# Patient Record
Sex: Male | Born: 1975 | Race: White | Hispanic: No | Marital: Married | Smoking: Former smoker
Health system: Southern US, Community
[De-identification: ages and names within clinical notes are randomized; demographics above are authoritative.]

## PROBLEM LIST (undated history)

## (undated) DIAGNOSIS — G459 Transient cerebral ischemic attack, unspecified: Secondary | ICD-10-CM

## (undated) DIAGNOSIS — F32A Depression, unspecified: Secondary | ICD-10-CM

## (undated) DIAGNOSIS — E7800 Pure hypercholesterolemia, unspecified: Secondary | ICD-10-CM

## (undated) DIAGNOSIS — I10 Essential (primary) hypertension: Secondary | ICD-10-CM

## (undated) DIAGNOSIS — F329 Major depressive disorder, single episode, unspecified: Secondary | ICD-10-CM

## (undated) HISTORY — DX: Transient cerebral ischemic attack, unspecified: G45.9

## (undated) HISTORY — PX: CHOLECYSTECTOMY: SHX55

## (undated) HISTORY — DX: Pure hypercholesterolemia, unspecified: E78.00

## (undated) HISTORY — PX: ELBOW SURGERY: SHX618

## (undated) HISTORY — DX: Essential (primary) hypertension: I10

---

## 2001-03-17 ENCOUNTER — Encounter (INDEPENDENT_AMBULATORY_CARE_PROVIDER_SITE_OTHER): Payer: Self-pay | Admitting: *Deleted

## 2001-03-17 ENCOUNTER — Ambulatory Visit (HOSPITAL_COMMUNITY): Admission: RE | Admit: 2001-03-17 | Discharge: 2001-03-18 | Payer: Self-pay | Admitting: Surgery

## 2001-03-17 ENCOUNTER — Encounter: Payer: Self-pay | Admitting: Surgery

## 2004-09-04 ENCOUNTER — Emergency Department (HOSPITAL_COMMUNITY): Admission: EM | Admit: 2004-09-04 | Discharge: 2004-09-04 | Payer: Self-pay | Admitting: Emergency Medicine

## 2008-02-10 ENCOUNTER — Ambulatory Visit (HOSPITAL_COMMUNITY): Admission: RE | Admit: 2008-02-10 | Discharge: 2008-02-10 | Payer: Self-pay | Admitting: Family Medicine

## 2008-03-06 ENCOUNTER — Encounter
Admission: RE | Admit: 2008-03-06 | Discharge: 2008-04-11 | Payer: Self-pay | Admitting: Physical Medicine and Rehabilitation

## 2010-12-13 NOTE — Op Note (Signed)
[place]  Patient:    Eugene Mitchell, Eugene Mitchell Visit Number: [id number] MRN: [id number]          Service Type: DSU Location: [id number] Attending Physician:  Andre Lefort Proc. Date: 03/17/01 Adm. Date:  03/17/2001   CC:         Dr. Charlesetta Shanks, [facility]; [address] [place], [place]             [id number]   Operative Report  PREOPERATIVE DIAGNOSIS:  Symptomatic cholelithiasis.  POSTOPERATIVE DIAGNOSIS:  Chronic cholecystitis with cholelithiasis.  OPERATION:  Laparoscopic cholecystectomy with intraoperative cholangiogram.  SURGEON:  Sandria Bales. Ezzard Standing, M.D.  ASSISTANT:  Gita Kudo, M.D.  ANESTHESIA:  General endotracheal.  ESTIMATED BLOOD LOSS:  Minimal.  INDICATIONS:  Eugene Mitchell is a 35 year old white male who comes with symptomatic cholelithiasis and now comes for attempt at laparoscopic cholecystectomy.  The indications and potential complications of the procedure were explained to the patient.  DESCRIPTION OF PROCEDURE:  The patient was placed in the operating room and given a general endotracheal anesthetic as supervised by Dr. Judie Petit. He had 1 g of Ancef at the initiation of the procedure.  He had PAS stockings in place and oral gastric tube placed.  His abdomen was prepped with Betadine solution and sterilely draped.  An infraumbilical incision was made with sharp dissection carried down into the abdominal cavity.  A 0 degree 10 mm laparoscope was inserted through a 12 mm Hasson trocar and the Hasson trocar was secured with a 0 Vicryl suture. The abdomen was then explored.  The right and left lobes of the liver were unremarkable.  The stomach was mildly dilated which was aspirated out with the NG tube.  The remainder of the abdomen was covered pretty much with omentum.  Three additional trocars were then placed.  A 10 mm Ethicon trocar in the subxiphoid location, a 5 mm  Ethicon trocar in the right mid subcostal location and a 5 mm Apple trocar in the right lateral subcostal location.  The gallbladder was grasped, rotated cephalad.  The gallbladder was noted to be invested with fat almost the entire length of the gallbladder and I spent probably a good 10-15 minutes dissecting this invested fat off the gallbladder.  I got down to the cystic duct/gallbladder junction.  I then clipped the cystic artery to the anterior branch, placed a gallbladder clip on the cystic duct.   I shot an intraoperative cholangiogram.  An intraoperative cholangiogram was shot using cut off taut catheter inserted through a 16 gauge Gelco, and this was then inserted through the side of the cystic duct.  I use half strength Hypaque solution about 8 cc and at fluoroscopy showing free flow of contrast down the cystic duct down into the common bile duct into the duodenum.  There was free reflux back into both hepatic radicles.  There was no filling defect and no mass within the common bile duct, and this was felt to be a normal intraoperative cholangiogram.  The taut catheter was then removed.  The cystic duct was triply endoclipped and divided.  The gallbladder was then sharply and blunted dissected from the gallbladder bed using primarily hook Bovie coagulation.  There was a posterior branch identified.  It was doubly endoclipped, and the gallbladder was then sharply taken out with the gallbladder bed.  Prior to complete division of the gallbladder from the gallbladder bed, the triangle of Calot was visualized.  The gallbladder and the triangle of Calot was irrigated with saline and the gallbladder was divided and delivered through the umbilicus and sent to pathology.  The abdomen was then irrigated and each trocar was then removed under direct visualization.  The umbilical trocar site was closed with a 0 Vicryl suture. The skin incision at each site was closed with a 5-0 Vicryl  suture, painted with tincture of Benzoin, steri-stripped and sterilely dressed.  The patient tolerated the procedure well and was transported to the recovery room in good condition.  Sponge and needle count were correct at the end of the case. Attending Physician:  Andre Lefort DD:  03/17/01 TD:  03/17/01 Job: [id number]

## 2012-12-30 ENCOUNTER — Ambulatory Visit (INDEPENDENT_AMBULATORY_CARE_PROVIDER_SITE_OTHER): Payer: BC Managed Care – PPO | Admitting: Nurse Practitioner

## 2012-12-30 ENCOUNTER — Ambulatory Visit (INDEPENDENT_AMBULATORY_CARE_PROVIDER_SITE_OTHER): Payer: BC Managed Care – PPO

## 2012-12-30 VITALS — BP 123/74 | HR 91 | Temp 98.7°F | Ht 68.0 in | Wt 230.0 lb

## 2012-12-30 DIAGNOSIS — I10 Essential (primary) hypertension: Secondary | ICD-10-CM

## 2012-12-30 DIAGNOSIS — T1490XA Injury, unspecified, initial encounter: Secondary | ICD-10-CM

## 2012-12-30 DIAGNOSIS — S60221A Contusion of right hand, initial encounter: Secondary | ICD-10-CM

## 2012-12-30 DIAGNOSIS — S60229A Contusion of unspecified hand, initial encounter: Secondary | ICD-10-CM

## 2012-12-30 NOTE — Patient Instructions (Signed)
Contusion A contusion is a deep bruise. Contusions are the result of an injury that caused bleeding under the skin. The contusion may turn blue, purple, or yellow. Minor injuries will give you a painless contusion, but more severe contusions may stay painful and swollen for a few weeks.  CAUSES  A contusion is usually caused by a blow, trauma, or direct force to an area of the body. SYMPTOMS   Swelling and redness of the injured area.  Bruising of the injured area.  Tenderness and soreness of the injured area.  Pain. DIAGNOSIS  The diagnosis can be made by taking a history and physical exam. An X-ray, CT scan, or MRI may be needed to determine if there were any associated injuries, such as fractures. TREATMENT  Specific treatment will depend on what area of the body was injured. In general, the best treatment for a contusion is resting, icing, elevating, and applying cold compresses to the injured area. Over-the-counter medicines may also be recommended for pain control. Ask your caregiver what the best treatment is for your contusion. HOME CARE INSTRUCTIONS   Put ice on the injured area.  Put ice in a plastic bag.  Place a towel between your skin and the bag.  Leave the ice on for 15-20 minutes, 3-4 times a day.  Only take over-the-counter or prescription medicines for pain, discomfort, or fever as directed by your caregiver. Your caregiver may recommend avoiding anti-inflammatory medicines (aspirin, ibuprofen, and naproxen) for 48 hours because these medicines may increase bruising.  Rest the injured area.  If possible, elevate the injured area to reduce swelling. SEEK IMMEDIATE MEDICAL CARE IF:   You have increased bruising or swelling.  You have pain that is getting worse.  Your swelling or pain is not relieved with medicines. MAKE SURE YOU:   Understand these instructions.  Will watch your condition.  Will get help right away if you are not doing well or get  worse. Document Released: 04/23/2005 Document Revised: 10/06/2011 Document Reviewed: 05/19/2011 Exit[employer] Patient Information 2014 [employer].  

## 2012-12-30 NOTE — Progress Notes (Signed)
  Subjective:    Patient ID: Eugene Mitchell, male    DOB: 30-Apr-1976, 37 y.o.   MRN: [id number]  Hand Pain  The incident occurred 6 to 12 hours ago. The incident occurred at home. The injury mechanism was a direct blow. The pain is present in the right hand. The quality of the pain is described as shooting and cramping. The pain radiates to the right arm (finger tips). The pain is at a severity of 5/10. The pain is mild. The pain has been fluctuating since the incident. Pertinent negatives include no numbness or tingling. He has tried nothing for the symptoms.   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    Review of Systems  Musculoskeletal: Positive for joint swelling.       Right hand pain  Neurological: Negative for tingling and numbness.  All other systems reviewed and are negative.       Objective:   Physical Exam  Constitutional: He appears well-developed and well-nourished.  Cardiovascular: Normal rate, regular rhythm and normal heart sounds.   Pulmonary/Chest: Effort normal and breath sounds normal.  Musculoskeletal: He exhibits tenderness.  Right hand pain with movement, Trace amount of swelling at 4th proximal metacarpal head  Skin: Skin is warm and dry.  Psychiatric: He has a normal mood and affect. His behavior is normal. Judgment and thought content normal.     BP 123/74  Pulse 91  Temp(Src) 98.7 F (37.1 C) (Oral)  Ht 5\' 8"  (1.727 m)  Wt 230 lb (104.327 kg)  BMI 34.98 kg/m2  Right ane xray- No acute findings- no fracture-Preliminary reading by Paulene Floor, FNP  [facility]     Assessment & Plan:   1. Hand contusion, right, initial encounter   2. Injury   3. Hypertension    Orders Placed This Encounter  Procedures  . DG Hand Complete Right    Standing Status: Future     Number of Occurrences: 1     Standing Expiration Date: 03/01/2014    Order Specific Question:  Reason for Exam (SYMPTOM  OR DIAGNOSIS REQUIRED)    Answer:  injury     Order Specific Question:  Preferred imaging location?    Answer:  Internal   Ice if helps Epsom salt soaks bid RTO PRN  Mary-Margaret Daphine Deutscher, FNP

## 2014-06-19 ENCOUNTER — Ambulatory Visit: Payer: BC Managed Care – PPO | Attending: Sports Medicine | Admitting: Physical Therapy

## 2014-06-19 DIAGNOSIS — Z5189 Encounter for other specified aftercare: Secondary | ICD-10-CM | POA: Diagnosis not present

## 2014-06-19 DIAGNOSIS — M25522 Pain in left elbow: Secondary | ICD-10-CM | POA: Diagnosis not present

## 2014-06-19 DIAGNOSIS — M7712 Lateral epicondylitis, left elbow: Secondary | ICD-10-CM | POA: Insufficient documentation

## 2014-06-19 DIAGNOSIS — I10 Essential (primary) hypertension: Secondary | ICD-10-CM | POA: Diagnosis not present

## 2014-06-28 ENCOUNTER — Ambulatory Visit: Payer: BC Managed Care – PPO | Attending: Sports Medicine | Admitting: Physical Therapy

## 2014-06-28 DIAGNOSIS — M7712 Lateral epicondylitis, left elbow: Secondary | ICD-10-CM | POA: Insufficient documentation

## 2014-06-28 DIAGNOSIS — M25522 Pain in left elbow: Secondary | ICD-10-CM | POA: Diagnosis not present

## 2014-06-28 DIAGNOSIS — I10 Essential (primary) hypertension: Secondary | ICD-10-CM | POA: Insufficient documentation

## 2014-06-28 DIAGNOSIS — Z5189 Encounter for other specified aftercare: Secondary | ICD-10-CM | POA: Insufficient documentation

## 2014-06-29 ENCOUNTER — Ambulatory Visit: Payer: BC Managed Care – PPO | Admitting: Physical Therapy

## 2014-06-29 DIAGNOSIS — Z5189 Encounter for other specified aftercare: Secondary | ICD-10-CM | POA: Diagnosis not present

## 2014-07-03 ENCOUNTER — Ambulatory Visit: Payer: BC Managed Care – PPO | Admitting: Physical Therapy

## 2014-07-03 DIAGNOSIS — Z5189 Encounter for other specified aftercare: Secondary | ICD-10-CM | POA: Diagnosis not present

## 2014-07-04 ENCOUNTER — Ambulatory Visit: Payer: BC Managed Care – PPO | Admitting: Physical Therapy

## 2014-07-04 DIAGNOSIS — Z5189 Encounter for other specified aftercare: Secondary | ICD-10-CM | POA: Diagnosis not present

## 2014-07-12 ENCOUNTER — Ambulatory Visit: Payer: BC Managed Care – PPO | Admitting: Physical Therapy

## 2014-07-12 DIAGNOSIS — Z5189 Encounter for other specified aftercare: Secondary | ICD-10-CM | POA: Diagnosis not present

## 2014-07-13 ENCOUNTER — Ambulatory Visit: Payer: BC Managed Care – PPO | Admitting: Physical Therapy

## 2014-07-13 DIAGNOSIS — Z5189 Encounter for other specified aftercare: Secondary | ICD-10-CM | POA: Diagnosis not present

## 2014-07-17 ENCOUNTER — Ambulatory Visit: Payer: BC Managed Care – PPO | Admitting: Physical Therapy

## 2014-07-17 DIAGNOSIS — Z5189 Encounter for other specified aftercare: Secondary | ICD-10-CM | POA: Diagnosis not present

## 2014-07-28 DIAGNOSIS — G459 Transient cerebral ischemic attack, unspecified: Secondary | ICD-10-CM

## 2014-07-28 HISTORY — DX: Transient cerebral ischemic attack, unspecified: G45.9

## 2014-10-16 ENCOUNTER — Emergency Department (HOSPITAL_COMMUNITY): Payer: BLUE CROSS/BLUE SHIELD

## 2014-10-16 ENCOUNTER — Encounter (HOSPITAL_COMMUNITY): Payer: Self-pay

## 2014-10-16 ENCOUNTER — Observation Stay (HOSPITAL_COMMUNITY): Payer: BLUE CROSS/BLUE SHIELD

## 2014-10-16 ENCOUNTER — Observation Stay (HOSPITAL_COMMUNITY)
Admission: EM | Admit: 2014-10-16 | Discharge: 2014-10-18 | Disposition: A | Discharge status: Home / Self Care | Payer: BLUE CROSS/BLUE SHIELD | Attending: Internal Medicine | Admitting: Internal Medicine

## 2014-10-16 DIAGNOSIS — Z87891 Personal history of nicotine dependence: Secondary | ICD-10-CM | POA: Diagnosis not present

## 2014-10-16 DIAGNOSIS — Z823 Family history of stroke: Secondary | ICD-10-CM | POA: Diagnosis not present

## 2014-10-16 DIAGNOSIS — R531 Weakness: Secondary | ICD-10-CM | POA: Diagnosis not present

## 2014-10-16 DIAGNOSIS — I10 Essential (primary) hypertension: Secondary | ICD-10-CM | POA: Diagnosis not present

## 2014-10-16 DIAGNOSIS — E785 Hyperlipidemia, unspecified: Secondary | ICD-10-CM | POA: Diagnosis not present

## 2014-10-16 DIAGNOSIS — F329 Major depressive disorder, single episode, unspecified: Secondary | ICD-10-CM | POA: Diagnosis not present

## 2014-10-16 DIAGNOSIS — Z6834 Body mass index (BMI) 34.0-34.9, adult: Secondary | ICD-10-CM | POA: Insufficient documentation

## 2014-10-16 DIAGNOSIS — I2510 Atherosclerotic heart disease of native coronary artery without angina pectoris: Secondary | ICD-10-CM | POA: Diagnosis not present

## 2014-10-16 DIAGNOSIS — G459 Transient cerebral ischemic attack, unspecified: Principal | ICD-10-CM | POA: Insufficient documentation

## 2014-10-16 DIAGNOSIS — R29810 Facial weakness: Secondary | ICD-10-CM | POA: Insufficient documentation

## 2014-10-16 DIAGNOSIS — E669 Obesity, unspecified: Secondary | ICD-10-CM | POA: Insufficient documentation

## 2014-10-16 HISTORY — DX: Depression, unspecified: F32.A

## 2014-10-16 HISTORY — DX: Major depressive disorder, single episode, unspecified: F32.9

## 2014-10-16 LAB — COMPREHENSIVE METABOLIC PANEL
ALBUMIN: 4.4 g/dL (ref 3.5–5.2)
ALK PHOS: 74 U/L (ref 39–117)
ALT: 38 U/L (ref 0–53)
AST: 21 U/L (ref 0–37)
Anion gap: 9 (ref 5–15)
BILIRUBIN TOTAL: 0.7 mg/dL (ref 0.3–1.2)
BUN: 7 mg/dL (ref 6–23)
CHLORIDE: 104 mmol/L (ref 96–112)
CO2: 28 mmol/L (ref 19–32)
Calcium: 9.8 mg/dL (ref 8.4–10.5)
Creatinine, Ser: 0.76 mg/dL (ref 0.50–1.35)
GLUCOSE: 84 mg/dL (ref 70–99)
POTASSIUM: 3.8 mmol/L (ref 3.5–5.1)
Sodium: 141 mmol/L (ref 135–145)
Total Protein: 7.3 g/dL (ref 6.0–8.3)

## 2014-10-16 LAB — APTT: APTT: 28 s (ref 24–37)

## 2014-10-16 LAB — I-STAT TROPONIN, ED: TROPONIN I, POC: 0 ng/mL (ref 0.00–0.08)

## 2014-10-16 LAB — CBC
HCT: 44.2 % (ref 39.0–52.0)
Hemoglobin: 15 g/dL (ref 13.0–17.0)
MCH: 29 pg (ref 26.0–34.0)
MCHC: 33.9 g/dL (ref 30.0–36.0)
MCV: 85.5 fL (ref 78.0–100.0)
PLATELETS: 199 10*3/uL (ref 150–400)
RBC: 5.17 MIL/uL (ref 4.22–5.81)
RDW: 12.8 % (ref 11.5–15.5)
WBC: 7.2 10*3/uL (ref 4.0–10.5)

## 2014-10-16 LAB — DIFFERENTIAL
BASOS ABS: 0 10*3/uL (ref 0.0–0.1)
Basophils Relative: 0 % (ref 0–1)
Eosinophils Absolute: 0.1 10*3/uL (ref 0.0–0.7)
Eosinophils Relative: 1 % (ref 0–5)
Lymphocytes Relative: 28 % (ref 12–46)
Lymphs Abs: 2 10*3/uL (ref 0.7–4.0)
MONOS PCT: 5 % (ref 3–12)
Monocytes Absolute: 0.4 10*3/uL (ref 0.1–1.0)
NEUTROS ABS: 4.7 10*3/uL (ref 1.7–7.7)
NEUTROS PCT: 66 % (ref 43–77)

## 2014-10-16 LAB — PROTIME-INR
INR: 0.95 (ref 0.00–1.49)
Prothrombin Time: 12.8 seconds (ref 11.6–15.2)

## 2014-10-16 LAB — CBG MONITORING, ED: Glucose-Capillary: 100 mg/dL — ABNORMAL HIGH (ref 70–99)

## 2014-10-16 MED ORDER — SODIUM CHLORIDE 0.9 % IJ SOLN
3.0000 mL | Freq: Two times a day (BID) | INTRAMUSCULAR | Status: DC
  Administered 2014-10-16 – 2014-10-17 (×3): 3 mL via INTRAVENOUS

## 2014-10-16 MED ORDER — SODIUM CHLORIDE 0.9 % IJ SOLN: 3.0000 mL | INTRAMUSCULAR | Status: DC | PRN

## 2014-10-16 MED ORDER — ACETAMINOPHEN 325 MG PO TABS
650.0000 mg | ORAL_TABLET | ORAL | Status: DC | PRN
  Administered 2014-10-16: 650 mg via ORAL
  Filled 2014-10-16: qty 2

## 2014-10-16 MED ORDER — STROKE: EARLY STAGES OF RECOVERY BOOK
Freq: Once | Status: DC
  Filled 2014-10-16: qty 1

## 2014-10-16 MED ORDER — ALBUTEROL SULFATE (2.5 MG/3ML) 0.083% IN NEBU
2.5000 mg | INHALATION_SOLUTION | RESPIRATORY_TRACT | Status: DC | PRN
Start: 2014-10-16 — End: 2014-10-18

## 2014-10-16 MED ORDER — SODIUM CHLORIDE 0.9 % IV SOLN: 250.0000 mL | INTRAVENOUS | Status: DC | PRN

## 2014-10-16 MED ORDER — LISINOPRIL 10 MG PO TABS
10.0000 mg | ORAL_TABLET | Freq: Every day | ORAL | Status: DC
  Administered 2014-10-17 – 2014-10-18 (×2): 10 mg via ORAL
  Filled 2014-10-16 (×2): qty 1

## 2014-10-16 MED ORDER — HEPARIN SODIUM (PORCINE) 5000 UNIT/ML IJ SOLN
5000.0000 [IU] | Freq: Three times a day (TID) | INTRAMUSCULAR | Status: DC
  Administered 2014-10-16 – 2014-10-18 (×4): 5000 [IU] via SUBCUTANEOUS
  Filled 2014-10-16 (×4): qty 1

## 2014-10-16 MED ORDER — ASPIRIN 325 MG PO TABS
325.0000 mg | ORAL_TABLET | Freq: Every day | ORAL | Status: DC
  Administered 2014-10-16 – 2014-10-18 (×3): 325 mg via ORAL
  Filled 2014-10-16 (×3): qty 1

## 2014-10-16 MED ORDER — ONDANSETRON HCL 4 MG/2ML IJ SOLN: 4.0000 mg | Freq: Four times a day (QID) | INTRAMUSCULAR | Status: DC | PRN

## 2014-10-16 NOTE — ED Notes (Signed)
Called patient placement currently waiting for neuro bed to become available.

## 2014-10-16 NOTE — H&P (Signed)
PATIENT DETAILS Name: Eugene Mitchell Age: 39 y.o. Sex: male Date of Birth: 1975/12/27 Admit Date: 10/16/2014 GYJ:EHUDJS,HFWY MARGARET, FNP   CHIEF COMPLAINT:  Transient Right facial numbness, facial droop-lasting 5 minutes at around 9:30 AM this morning  HPI: Eugene Mitchell is a 39 y.o. male with a Past Medical History of hypertension who presents today with the above noted complaint. Per patient, he was at work when his coworkers noted him to have right facial droop, at the same time he was having numbness on the right side of his face. Coworkers also noted that he was drooling from the right side of his mouth. Patient denied any right upper, right lower extremity weakness. Apparently speech was not affected during this time. These symptoms lasted for approximately 5 minutes or so, following which patient was back to his usual self. He was subsequently brought to the emergency room, and the hospitalist service was asked to admit this patient for further evaluation and treatment Patient denies any recent fever, headache, chest pain, shortness of breath, nausea, vomiting, diarrhea or abdominal pain Patient has a family history-father had a CVA. He does not smoke   ALLERGIES:  No Known Allergies  PAST MEDICAL HISTORY: Past Medical History  Diagnosis Date  . Hypertension   . Depression     PAST SURGICAL HISTORY: History reviewed. No pertinent past surgical history.  MEDICATIONS AT HOME: Prior to Admission medications   Medication Sig Start Date End Date Taking? Authorizing Provider  lisinopril (PRINIVIL,ZESTRIL) 10 MG tablet Take 10 mg by mouth daily.    Historical Provider, MD    FAMILY HISTORY: Family History  Problem Relation Age of Onset  . Stroke Father   . Coronary artery disease Father     SOCIAL HISTORY:  reports that he has quit smoking. He does not have any smokeless tobacco history on file. He reports that he does not drink alcohol or use illicit  drugs.  REVIEW OF SYSTEMS:  Constitutional:   No  weight loss, night sweats,  Fevers, chills, fatigue.  HEENT:    No headaches, Difficulty swallowing,Tooth/dental problems,Sore throat,  Cardio-vascular: No chest pain,  Orthopnea, PND, swelling in lower extremities, anasarca  GI:  No heartburn, indigestion, abdominal pain, nausea, vomiting, diarrhea  Resp: No shortness of breath with exertion or at rest.  No excess mucus, no productive cough, No non-productive cough,  No coughing up of blood.No change in color of mucus.No wheezing.No chest wall deformity  Skin:  no rash or lesions.  GU:  no dysuria, change in color of urine, no urgency or frequency.  No flank pain.  Musculoskeletal: No joint pain or swelling.  No decreased range of motion.  No back pain.  Psych: No change in mood or affect. No depression or anxiety.  No memory loss.   PHYSICAL EXAM: Blood pressure 148/92, pulse 94, temperature 97.9 F (36.6 C), temperature source Oral, resp. rate 20, height 5\' 8"  (1.727 m), weight 104.327 kg (230 lb), SpO2 95 %.  General appearance :Awake, alert, not in any distress. Speech Clear. Not toxic Looking HEENT: Atraumatic and Normocephalic, pupils equally reactive to light and accomodation Neck: supple, no JVD. No cervical lymphadenopathy.  Chest:Good air entry bilaterally, no added sounds  CVS: S1 S2 regular, no murmurs.  Abdomen: Bowel sounds present, Non tender and not distended with no gaurding, rigidity or rebound. Extremities: B/L Lower Ext shows no edema, both legs are warm to touch Neurology: Awake alert, and oriented X  3, CN II-XII intact, Non focal Skin:No Rash Wounds:N/A  LABS ON ADMISSION:  No results for input(s): NA, K, CL, CO2, GLUCOSE, BUN, CREATININE, CALCIUM, MG, PHOS in the last 72 hours. No results for input(s): AST, ALT, ALKPHOS, BILITOT, PROT, ALBUMIN in the last 72 hours. No results for input(s): LIPASE, AMYLASE in the last 72 hours. No results for  input(s): WBC, NEUTROABS, HGB, HCT, MCV, PLT in the last 72 hours. No results for input(s): CKTOTAL, CKMB, CKMBINDEX, TROPONINI in the last 72 hours. No results for input(s): DDIMER in the last 72 hours. Invalid input(s): POCBNP   RADIOLOGIC STUDIES ON ADMISSION: Ct Head (brain) Wo Contrast  10/16/2014   CLINICAL DATA:  Right-sided facial droop, now resolved  EXAM: CT HEAD WITHOUT CONTRAST  TECHNIQUE: Contiguous axial images were obtained from the base of the skull through the vertex without intravenous contrast.  COMPARISON:  None.  FINDINGS: Punctate calcifications within the left basal ganglia. Gray-white differentiation is otherwise well maintained. No CT evidence of acute large territory infarct. No intraparenchymal or extra-axial mass or hemorrhage. Normal size and configuration of the ventricles and basilar cisterns. No midline shift. Minimal intracranial atherosclerosis.  Scattered polypoid mucosal thickening on following the left maxillary and right sphenoid sinus. The remaining paranasal sinuses and mastoid air cells are normally aerated. No air-fluid levels. Regional soft tissues appear normal. No displaced calvarial fracture.  IMPRESSION: Negative noncontrast head CT.   Electronically Signed   By: Sandi Mariscal M.D.   On: 10/16/2014 11:42     EKG: Independently reviewed. Normal sinus rhythm  ASSESSMENT AND PLAN: Present on Admission:  . TIA (transient ischemic attack): Presented with right sided facial weakness and numbness, the symptoms have completely resolved. Currently without any focal deficits. CT of the head is negative. Await laboratory data, will start aspirin. ED MD will get neurology consultation. However will plan on overnight admission and plan to get a MRI brain, echocardiogram, carotid Doppler, A1c and lipid panel. Will follow clinical course, and await further recommendations from neurology.  . Hypertension: Continue cautiously with lisinopril starting tomorrow-holding  parameters in place.  Further plan will depend as patient's clinical course evolves and further radiologic and laboratory data become available. Patient will be monitored closely.  Above noted plan was discussed with patient/family(son/mother), they were in agreement.   DVT Prophylaxis: Prophylactic  Heparin  Code Status: Full Code  Disposition Plan: Suspect home in am    Total time spent for admission equals 45 minutes.  [facility] Pager [phone]  If 7PM-7AM, please contact night-coverage [email or web address] Password [email or web address] 10/16/2014, 12:20 PM

## 2014-10-16 NOTE — ED Notes (Signed)
Pt here for right side jaw pain and tingling. Onset at work today now resolved completely coworker stated that had some facial droop as well but none noted on arrival by ems and none here. Pt is on htn meds and recently stopped taking antidepressant cymbalta with out doctor direction because he was feeling better.

## 2014-10-16 NOTE — Progress Notes (Signed)
Patient arrived to 4N02 AAOx4 and pleasant. Able to walk with no difficulty. Numbness in face has resolved and headache has eased off. Vital signs taken and questions answered. Will continue to monitor. Eugene Mitchell, Rande Brunt

## 2014-10-16 NOTE — Consult Note (Signed)
Admission H&P    Chief Complaint: Transient weakness and numbness involving right side of the face.  HPI: Eugene Mitchell is an 39 y.o. male history of hypertension presenting following an episode of numbness and weakness involving the right side of his face at 9:30 AM today. Numbness lasted for a few minutes. She'll droop reportedly lasted for about half an hour then resolved. He had no involvement of right upper and lower extremity. He has no previous history of stroke nor TIA. He has not been on antiplatelet therapy. CT scan of his head showed no acute intracranial abnormality. NIH stroke score was 0 at the time of this evaluation. Patient is being admitted for stroke risk assessment.  LSN: 9:30 AM on 10/16/2014 tPA Given: No: Deficits resolved MRankin: 0  Past Medical History  Diagnosis Date  . Hypertension   . Depression     History reviewed. No pertinent past surgical history.  Family History  Problem Relation Age of Onset  . Stroke Father   . Coronary artery disease Father    Social History:  reports that he has quit smoking. He does not have any smokeless tobacco history on file. He reports that he does not drink alcohol or use illicit drugs.  Allergies: No Known Allergies  Medications: Patient's preadmission medications were reviewed by me.  ROS: History obtained from the patient  General ROS: negative for - chills, fatigue, fever, night sweats, weight gain or weight loss Psychological ROS: negative for - behavioral disorder, hallucinations, memory difficulties, mood swings or suicidal ideation Ophthalmic ROS: negative for - blurry vision, double vision, eye pain or loss of vision ENT ROS: negative for - epistaxis, nasal discharge, oral lesions, sore throat, tinnitus or vertigo Allergy and Immunology ROS: negative for - hives or itchy/watery eyes Hematological and Lymphatic ROS: negative for - bleeding problems, bruising or swollen lymph nodes Endocrine ROS: negative  for - galactorrhea, hair pattern changes, polydipsia/polyuria or temperature intolerance Respiratory ROS: negative for - cough, hemoptysis, shortness of breath or wheezing Cardiovascular ROS: negative for - chest pain, dyspnea on exertion, edema or irregular heartbeat Gastrointestinal ROS: negative for - abdominal pain, diarrhea, hematemesis, nausea/vomiting or stool incontinence Genito-Urinary ROS: negative for - dysuria, hematuria, incontinence or urinary frequency/urgency Musculoskeletal ROS: negative for - joint swelling or muscular weakness Neurological ROS: as noted in HPI Dermatological ROS: negative for rash and skin lesion changes  Physical Examination: Blood pressure 114/72, pulse 100, temperature 97.9 F (36.6 C), temperature source Oral, resp. rate 21, height 5' 8"  (1.727 m), weight 104.327 kg (230 lb), SpO2 95 %.  HEENT-  Normocephalic, no lesions, without obvious abnormality.  Normal external eye and conjunctiva.  Normal TM's bilaterally.  Normal auditory canals and external ears. Normal external nose, mucus membranes and septum.  Normal pharynx. Neck supple with no masses, nodes, nodules or enlargement. Cardiovascular - regular rate and rhythm, S1, S2 normal, no murmur, click, rub or gallop Lungs - chest clear, no wheezing, rales, normal symmetric air entry, Heart exam - S1, S2 normal, no murmur, no gallop, rate regular Abdomen - soft, non-tender; bowel sounds normal; no masses,  no organomegaly Extremities - no joint deformities, effusion, or inflammation, no edema and no skin discoloration  Neurologic Examination: Mental Status: Alert, oriented, thought content appropriate.  Speech fluent without evidence of aphasia. Able to follow commands without difficulty. Cranial Nerves: II-Visual fields were normal. III/IV/VI-Pupils were equal and reacted. Extraocular movements were full and conjugate.    V/VII-no facial numbness and no facial weakness.  VIII-normal. X-normal speech  and symmetrical palatal movement. XI: trapezius strength/neck flexion strength normal bilaterally XII-midline tongue extension with normal strength. Motor: 5/5 bilaterally with normal tone and bulk Sensory: Normal throughout. Deep Tendon Reflexes: 2+ and symmetric. Plantars: Mute bilaterally Cerebellar: Normal finger-to-nose testing. Carotid auscultation: Normal  Results for orders placed or performed during the hospital encounter of 10/16/14 (from the past 48 hour(s))  Protime-INR     Status: None   Collection Time: 10/16/14 12:20 PM  Result Value Ref Range   Prothrombin Time 12.8 11.6 - 15.2 seconds   INR 0.95 0.00 - 1.49  APTT     Status: None   Collection Time: 10/16/14 12:20 PM  Result Value Ref Range   aPTT 28 24 - 37 seconds  CBC     Status: None   Collection Time: 10/16/14 12:20 PM  Result Value Ref Range   WBC 7.2 4.0 - 10.5 K/uL   RBC 5.17 4.22 - 5.81 MIL/uL   Hemoglobin 15.0 13.0 - 17.0 g/dL   HCT 44.2 39.0 - 52.0 %   MCV 85.5 78.0 - 100.0 fL   MCH 29.0 26.0 - 34.0 pg   MCHC 33.9 30.0 - 36.0 g/dL   RDW 12.8 11.5 - 15.5 %   Platelets 199 150 - 400 K/uL  Differential     Status: None   Collection Time: 10/16/14 12:20 PM  Result Value Ref Range   Neutrophils Relative % 66 43 - 77 %   Neutro Abs 4.7 1.7 - 7.7 K/uL   Lymphocytes Relative 28 12 - 46 %   Lymphs Abs 2.0 0.7 - 4.0 K/uL   Monocytes Relative 5 3 - 12 %   Monocytes Absolute 0.4 0.1 - 1.0 K/uL   Eosinophils Relative 1 0 - 5 %   Eosinophils Absolute 0.1 0.0 - 0.7 K/uL   Basophils Relative 0 0 - 1 %   Basophils Absolute 0.0 0.0 - 0.1 K/uL  Comprehensive metabolic panel     Status: None   Collection Time: 10/16/14 12:20 PM  Result Value Ref Range   Sodium 141 135 - 145 mmol/L   Potassium 3.8 3.5 - 5.1 mmol/L   Chloride 104 96 - 112 mmol/L   CO2 28 19 - 32 mmol/L   Glucose, Bld 84 70 - 99 mg/dL   BUN 7 6 - 23 mg/dL   Creatinine, Ser 0.76 0.50 - 1.35 mg/dL   Calcium 9.8 8.4 - 10.5 mg/dL   Total  Protein 7.3 6.0 - 8.3 g/dL   Albumin 4.4 3.5 - 5.2 g/dL   AST 21 0 - 37 U/L   ALT 38 0 - 53 U/L   Alkaline Phosphatase 74 39 - 117 U/L   Total Bilirubin 0.7 0.3 - 1.2 mg/dL   GFR calc non Af Amer >90 >90 mL/min   GFR calc Af Amer >90 >90 mL/min    Comment: (NOTE) The eGFR has been calculated using the CKD EPI equation. This calculation has not been validated in all clinical situations. eGFR's persistently <90 mL/min signify possible Chronic Kidney Disease.    Anion gap 9 5 - 15  I-stat troponin, ED (not at [facility])     Status: None   Collection Time: 10/16/14 12:44 PM  Result Value Ref Range   Troponin i, poc 0.00 0.00 - 0.08 ng/mL   Comment 3            Comment: Due to the release kinetics of cTnI, a negative result within the first hours  of the onset of symptoms does not rule out myocardial infarction with certainty. If myocardial infarction is still suspected, repeat the test at appropriate intervals.    Ct Head (brain) Wo Contrast  10/16/2014   CLINICAL DATA:  Right-sided facial droop, now resolved  EXAM: CT HEAD WITHOUT CONTRAST  TECHNIQUE: Contiguous axial images were obtained from the base of the skull through the vertex without intravenous contrast.  COMPARISON:  None.  FINDINGS: Punctate calcifications within the left basal ganglia. Gray-white differentiation is otherwise well maintained. No CT evidence of acute large territory infarct. No intraparenchymal or extra-axial mass or hemorrhage. Normal size and configuration of the ventricles and basilar cisterns. No midline shift. Minimal intracranial atherosclerosis.  Scattered polypoid mucosal thickening on following the left maxillary and right sphenoid sinus. The remaining paranasal sinuses and mastoid air cells are normally aerated. No air-fluid levels. Regional soft tissues appear normal. No displaced calvarial fracture.  IMPRESSION: Negative noncontrast head CT.   Electronically Signed   By: Sandi Mariscal M.D.   On: 10/16/2014  11:42    Assessment: 39 y.o. male with a history of hypertension presenting with probable left subcortical MCA territory transient ischemic attack. Small vessel subcortical stroke cannot be ruled out at this point, however.  Stroke Risk Factors - family history and hypertension  Plan: 1. HgbA1c, fasting lipid panel 2. MRI, MRA  of the brain without contrast 3. Echocardiogram 4. Carotid dopplers 5. Prophylactic therapy-Antiplatelet med: Aspirin 6. Telemetry monitoring  C.R. Nicole Kindred, MD [facility] [phone]  10/16/2014, 1:18 PM

## 2014-10-16 NOTE — ED Notes (Signed)
Onset today 0930 today developed right side facial droop witness by coworker lasting for 3-5 minutes. Currently states pain head achy 3/10. Alert answering following commands appropriate.

## 2014-10-16 NOTE — ED Notes (Signed)
Neurology at bedside.

## 2014-10-16 NOTE — ED Provider Notes (Signed)
CSN: [id number]     Arrival date & time 10/16/14  1056 History   First MD Initiated Contact with Patient 10/16/14 1058     Chief Complaint  Patient presents with  . Cerebrovascular Accident      HPI Patient states he developed acute onset numbness of his right face and his [place] at work noted a right-sided facial droop.  He was seen at the clinic at work and was brought to the emergency department for evaluation.  He states his symptoms were present for approximately 30 minutes and have completely resolved.  He never had symptoms in his arms or legs.  He denies weakness or numbness of his arms or legs at this time.  No difficulty with speech.  No prior history of stroke.  The patient does have a history of hypertension prior tobacco abuse.  No recent injury or trauma.  Past Medical History  Diagnosis Date  . Hypertension   . Depression    History reviewed. No pertinent past surgical history. Family History  Problem Relation Age of Onset  . Stroke Father   . Coronary artery disease Father    History  Substance Use Topics  . Smoking status: Former Research scientist (life sciences)  . Smokeless tobacco: Not on file  . Alcohol Use: No    Review of Systems  All other systems reviewed and are negative.     Allergies  Review of patient's allergies indicates no known allergies.  Home Medications   Prior to Admission medications   Medication Sig Start Date End Date Taking? Authorizing Provider  lisinopril (PRINIVIL,ZESTRIL) 10 MG tablet Take 10 mg by mouth daily.    Historical Provider, MD   BP 148/92 mmHg  Pulse 94  Temp(Src) 97.9 F (36.6 C) (Oral)  Resp 20  Ht 5\' 8"  (1.727 m)  Wt 230 lb (104.327 kg)  BMI 34.98 kg/m2  SpO2 95% Physical Exam  Constitutional: He is oriented to person, place, and time. He appears well-developed and well-nourished.  HENT:  Head: Normocephalic and atraumatic.  Eyes: EOM are normal. Pupils are equal, round, and reactive to light.  Neck: Normal range of motion.   Cardiovascular: Normal rate, regular rhythm, normal heart sounds and intact distal pulses.   Pulmonary/Chest: Effort normal and breath sounds normal. No respiratory distress.  Abdominal: Soft. He exhibits no distension. There is no tenderness.  Musculoskeletal: Normal range of motion.  Neurological: He is alert and oriented to person, place, and time.  5/5 strength in major muscle groups of  bilateral upper and lower extremities. Speech normal. No facial asymetry.   Skin: Skin is warm and dry.  Psychiatric: He has a normal mood and affect. Judgment normal.  Nursing note and vitals reviewed.   ED Course  Procedures (including critical care time) Labs Review Labs Reviewed  PROTIME-INR  APTT  CBC  DIFFERENTIAL  COMPREHENSIVE METABOLIC PANEL  I-STAT TROPOININ, ED  CBG MONITORING, ED    Imaging Review Ct Head (brain) Wo Contrast  10/16/2014   CLINICAL DATA:  Right-sided facial droop, now resolved  EXAM: CT HEAD WITHOUT CONTRAST  TECHNIQUE: Contiguous axial images were obtained from the base of the skull through the vertex without intravenous contrast.  COMPARISON:  None.  FINDINGS: Punctate calcifications within the left basal ganglia. Gray-white differentiation is otherwise well maintained. No CT evidence of acute large territory infarct. No intraparenchymal or extra-axial mass or hemorrhage. Normal size and configuration of the ventricles and basilar cisterns. No midline shift. Minimal intracranial atherosclerosis.  Scattered polypoid mucosal  thickening on following the left maxillary and right sphenoid sinus. The remaining paranasal sinuses and mastoid air cells are normally aerated. No air-fluid levels. Regional soft tissues appear normal. No displaced calvarial fracture.  IMPRESSION: Negative noncontrast head CT.   Electronically Signed   By: Sandi Mariscal M.D.   On: 10/16/2014 11:42     EKG Interpretation   Date/Time:  Monday October 16 2014 10:57:52 EDT Ventricular Rate:  97 PR  Interval:  219 QRS Duration: 99 QT Interval:  374 QTC Calculation: 475 R Axis:   14 Text Interpretation:  Sinus rhythm Prolonged PR interval ST elev, probable  normal early repol pattern Borderline prolonged QT interval Baseline  wander in lead(s) II III aVF No old tracing to compare Confirmed by Jahmiya Guidotti   MD, Johnny Gorter ([id number]) on 10/16/2014 11:20:06 AM      MDM   Final diagnoses:  Transient cerebral ischemia, unspecified transient cerebral ischemia type   Spoke with [facility] who will admit. Dr Nicole Kindred with neurology will consult. Appears consistent with TIA    Jola Schmidt, MD 10/16/14 [phone]

## 2014-10-17 DIAGNOSIS — G459 Transient cerebral ischemic attack, unspecified: Secondary | ICD-10-CM | POA: Diagnosis not present

## 2014-10-17 LAB — LIPID PANEL
CHOL/HDL RATIO: 5.5 ratio
Cholesterol: 199 mg/dL (ref 0–200)
HDL: 36 mg/dL — ABNORMAL LOW (ref >39)
LDL CALC: 108 mg/dL — AB (ref 0–99)
TRIGLYCERIDES: 274 mg/dL — AB (ref <150)
VLDL: 55 mg/dL — AB (ref 0–40)

## 2014-10-17 MED ORDER — ATORVASTATIN CALCIUM 10 MG PO TABS
20.0000 mg | ORAL_TABLET | Freq: Every day | ORAL | Status: DC
  Administered 2014-10-17: 20 mg via ORAL
  Filled 2014-10-17: qty 2

## 2014-10-17 NOTE — Progress Notes (Signed)
STROKE TEAM PROGRESS NOTE   HISTORY Eugene Mitchell is an 39 y.o. male history of hypertension presenting following an episode of numbness and weakness involving the right side of his face at 9:30 AM today 10/16/2014 ([facility]). Numbness lasted for a few minutes. Facial droop reportedly lasted for about half an hour then resolved. He had no involvement of right upper and lower extremity. He has no previous history of stroke nor TIA. He has not been on antiplatelet therapy. CT scan of his head showed no acute intracranial abnormality. NIH stroke score was 0 at the time of this evaluation. Patient is being admitted for stroke risk assessment. MRankin: 0 Patient was not administered TPA secondary to deficits resolved. He was admitted for further evaluation and treatment.   SUBJECTIVE (INTERVAL HISTORY) No family is at the bedside.  Overall he feels his condition is completely resolved. He reports a headache on and off, like pressure. No association migraine symptoms.   OBJECTIVE Temp:  [97.7 F (36.5 C)-98.2 F (36.8 C)] 98.1 F (36.7 C) (03/22 1010) Pulse Rate:  [55-106] 89 (03/22 1010) Cardiac Rhythm:  [-] Heart block (03/21 1924) Resp:  [16-21] 17 (03/22 1010) BP: (95-140)/(48-92) 134/83 mmHg (03/22 1020) SpO2:  [94 %-97 %] 94 % (03/22 1010) Weight:  [104.327 kg (230 lb)] 104.327 kg (230 lb) (03/21 1924)   Recent Labs Lab 10/16/14 1328  GLUCAP 100*    Recent Labs Lab 10/16/14 1220  NA 141  K 3.8  CL 104  CO2 28  GLUCOSE 84  BUN 7  CREATININE 0.76  CALCIUM 9.8    Recent Labs Lab 10/16/14 1220  AST 21  ALT 38  ALKPHOS 74  BILITOT 0.7  PROT 7.3  ALBUMIN 4.4    Recent Labs Lab 10/16/14 1220  WBC 7.2  NEUTROABS 4.7  HGB 15.0  HCT 44.2  MCV 85.5  PLT 199   No results for input(s): CKTOTAL, CKMB, CKMBINDEX, TROPONINI in the last 168 hours.  Recent Labs  10/16/14 1220  LABPROT 12.8  INR 0.95   No results for input(s): COLORURINE, LABSPEC, PHURINE, GLUCOSEU,  HGBUR, BILIRUBINUR, KETONESUR, PROTEINUR, UROBILINOGEN, NITRITE, LEUKOCYTESUR in the last 72 hours.  Invalid input(s): APPERANCEUR     Component Value Date/Time   CHOL 199 10/17/2014 0630   TRIG 274* 10/17/2014 0630   HDL 36* 10/17/2014 0630   CHOLHDL 5.5 10/17/2014 0630   VLDL 55* 10/17/2014 0630   LDLCALC 108* 10/17/2014 0630   No results found for: HGBA1C No results found for: LABOPIA, COCAINSCRNUR, LABBENZ, AMPHETMU, THCU, LABBARB  No results for input(s): ETH in the last 168 hours.  Ct Head (brain) Wo Contrast  10/16/2014   CLINICAL DATA:  Right-sided facial droop, now resolved  EXAM: CT HEAD WITHOUT CONTRAST  TECHNIQUE: Contiguous axial images were obtained from the base of the skull through the vertex without intravenous contrast.  COMPARISON:  None.  FINDINGS: Punctate calcifications within the left basal ganglia. Gray-white differentiation is otherwise well maintained. No CT evidence of acute large territory infarct. No intraparenchymal or extra-axial mass or hemorrhage. Normal size and configuration of the ventricles and basilar cisterns. No midline shift. Minimal intracranial atherosclerosis.  Scattered polypoid mucosal thickening on following the left maxillary and right sphenoid sinus. The remaining paranasal sinuses and mastoid air cells are normally aerated. No air-fluid levels. Regional soft tissues appear normal. No displaced calvarial fracture.  IMPRESSION: Negative noncontrast head CT.   Electronically Signed   By: Sandi Mariscal M.D.   On: 10/16/2014 11:42  Mri Brain Without Contrast  10/16/2014   CLINICAL DATA:  39 year old male with transient ischemic attack. Right facial numbness and droop lasting 5 minutes at 0930 hours today. Current history of hypertension. Initial encounter.  EXAM: MRI HEAD WITHOUT CONTRAST  MRA HEAD WITHOUT CONTRAST  TECHNIQUE: Multiplanar, multiecho pulse sequences of the brain and surrounding structures were obtained without intravenous contrast.  Angiographic images of the head were obtained using MRA technique without contrast.  COMPARISON:  Head CT without contrast 1144 hours today.  FINDINGS: MRI HEAD FINDINGS  Cerebral volume is normal. Major intracranial vascular flow voids are preserved. Dominant appearing distal right vertebral artery. No restricted diffusion to suggest acute infarction. No midline shift, mass effect, evidence of mass lesion, ventriculomegaly, extra-axial collection or acute intracranial hemorrhage. Cervicomedullary junction and pituitary are within normal limits. Negative visualized cervical spine.  Pearline Cables and white matter signal is within normal limits for age throughout the brain. No cortical encephalomalacia. Negative noncontrast cavernous sinus. Visible internal auditory structures appear normal. Mastoids are clear. Normal stylomastoid foramina.  Chronic right lamina papyracea fracture. Along the roof of the right sphenoid sinus there is a small 7 mm diameter semi circular area of signal which is isointense to the adjacent right inferior frontal gyrus cortex (series 11, image 23). No fluid in the paranasal sinuses. There is mild maxillary sinus mucosal thickening in the alveolar recesses greater on the right.  Visualized orbit soft tissues are within normal limits. Negative scalp soft tissues. Normal bone marrow signal.  MRA HEAD FINDINGS  Antegrade flow in the posterior circulation, dominant distal right vertebral artery. Normal PICA origins. The distal left vertebral artery functionally terminates in PICA. Patent basilar artery without stenosis. AICA and SCA origins are within normal limits. Fetal type bilateral PCA origins. Bilateral PCA branches are within normal limits.  Antegrade flow in both ICA siphons. No siphon stenosis. Ophthalmic and posterior communicating artery origins are within normal limits. Normal carotid termini, MCA and ACA origins. Anterior communicating artery and visualized bilateral ACA branches are within  normal limits. Visualized bilateral MCA branches are within normal limits.  IMPRESSION: 1.  No acute intracranial abnormality. 2. Small 7 mm area of signal along the roof of the right sphenoid sinus appears isointense to gray matter. Therefore, although this might reflect a small mucous retention cyst it is difficult to exclude a small sphenoid sinus encephalocele. No paranasal sinus fluid identified to suggest an associated CSF leak. 3. Otherwise normal for age noncontrast MRI appearance of the brain. 4.  Negative intracranial MRA.   Electronically Signed   By: Genevie Ann M.D.   On: 10/16/2014 16:09   Eugene Mitchell Head/brain Wo Cm  10/16/2014   CLINICAL DATA:  39 year old male with transient ischemic attack. Right facial numbness and droop lasting 5 minutes at 0930 hours today. Current history of hypertension. Initial encounter.  EXAM: MRI HEAD WITHOUT CONTRAST  MRA HEAD WITHOUT CONTRAST  TECHNIQUE: Multiplanar, multiecho pulse sequences of the brain and surrounding structures were obtained without intravenous contrast. Angiographic images of the head were obtained using MRA technique without contrast.  COMPARISON:  Head CT without contrast 1144 hours today.  FINDINGS: MRI HEAD FINDINGS  Cerebral volume is normal. Major intracranial vascular flow voids are preserved. Dominant appearing distal right vertebral artery. No restricted diffusion to suggest acute infarction. No midline shift, mass effect, evidence of mass lesion, ventriculomegaly, extra-axial collection or acute intracranial hemorrhage. Cervicomedullary junction and pituitary are within normal limits. Negative visualized cervical spine.  Pearline Cables and white matter  signal is within normal limits for age throughout the brain. No cortical encephalomalacia. Negative noncontrast cavernous sinus. Visible internal auditory structures appear normal. Mastoids are clear. Normal stylomastoid foramina.  Chronic right lamina papyracea fracture. Along the roof of the right  sphenoid sinus there is a small 7 mm diameter semi circular area of signal which is isointense to the adjacent right inferior frontal gyrus cortex (series 11, image 23). No fluid in the paranasal sinuses. There is mild maxillary sinus mucosal thickening in the alveolar recesses greater on the right.  Visualized orbit soft tissues are within normal limits. Negative scalp soft tissues. Normal bone marrow signal.  MRA HEAD FINDINGS  Antegrade flow in the posterior circulation, dominant distal right vertebral artery. Normal PICA origins. The distal left vertebral artery functionally terminates in PICA. Patent basilar artery without stenosis. AICA and SCA origins are within normal limits. Fetal type bilateral PCA origins. Bilateral PCA branches are within normal limits.  Antegrade flow in both ICA siphons. No siphon stenosis. Ophthalmic and posterior communicating artery origins are within normal limits. Normal carotid termini, MCA and ACA origins. Anterior communicating artery and visualized bilateral ACA branches are within normal limits. Visualized bilateral MCA branches are within normal limits.  IMPRESSION: 1.  No acute intracranial abnormality. 2. Small 7 mm area of signal along the roof of the right sphenoid sinus appears isointense to gray matter. Therefore, although this might reflect a small mucous retention cyst it is difficult to exclude a small sphenoid sinus encephalocele. No paranasal sinus fluid identified to suggest an associated CSF leak. 3. Otherwise normal for age noncontrast MRI appearance of the brain. 4.  Negative intracranial MRA.   Electronically Signed   By: Genevie Ann M.D.   On: 10/16/2014 16:09     PHYSICAL EXAM Obese middle-aged Caucasian male currently not in distress. . Afebrile. Head is nontraumatic. Neck is supple without bruit.    Cardiac exam no murmur or gallop. Lungs are clear to auscultation. Distal pulses are well felt. Neurological Exam ;  Awake  Alert oriented x 3. Normal  speech and language.eye movements full without nystagmus.fundi were not visualized. Vision acuity and fields appear normal. Hearing is normal. Palatal movements are normal. Face symmetric. Tongue midline. Normal strength, tone, reflexes and coordination. Normal sensation. Gait deferred. ASSESSMENT/PLAN Eugene Mitchell is a 39 y.o. male with history of hypertension presenting with transient weakness and numbness involving right side of the face. He did not receive IV t-PA due to resolved symptoms.   Transient R facial weakness and numbness, Possible TIA  Resultant  Numbness has resolved  MRI  No acute stroke  MRA  Unremarkable   Carotid Doppler  pending   2D Echo  pending   HgbA1c pending  Heparin 5000 units sq tid for VTE prophylaxis  Diet Heart thin liquids  no antithrombotic prior to admission, now on aspirin 325 mg orally every day. Given risk factors, would continue aspirin at discharge  Patient counseled to be compliant with his antithrombotic medications  Ongoing aggressive stroke risk factor management  Therapy recommendations:  No therapy needs  Disposition:  Return home once workup completed  Can follow up with Dr. Leonie Man in 2 months (order written)  Hypertension  Home meds:   lisinopril  Stable  Patient counseled to be compliant with his blood pressure medications  Hyperlipidemia  Home meds:  No statin  LDL 108  Statin added this admission  Continue statin at discharge  Other Stroke Risk Factors  Former Cigarette  smoker, quit smoking   Obesity, Body mass index is 34.98 kg/(m^2).   Family hx stroke (father)  Other Active Problems  Depression  Hospital day # [place] for Pager information 10/17/2014 11:58 AM  I have personally examined this patient, reviewed notes, independently viewed imaging studies, participated in medical decision making and plan of care. I have made any additions or  clarifications directly to the above note. Agree with note above. He had transient right hemi-face numbness of unclear etiology. Complicated migraine would be less likely in the absence of associated significant headache. Pattern of distribution of numbness is unusual for TIA but he does have vascular risk factors and is at risk for strokes and TIAs and neurological worsening hence we will continue ongoing stroke evaluation and start him on aspirin and aggressive risk factor modification  Antony Contras, MD Medical Director [facility] Pager: [phone] 10/17/2014 2:58 PM    To contact Stroke Continuity provider, please refer to [email or web address] After hours, contact General Neurology

## 2014-10-17 NOTE — Progress Notes (Signed)
[facility] PROGRESS NOTE  Eugene Mitchell LFY:[id number] DOB: Aug 30, 1975 DOA: 10/16/2014 PCP: Chevis Pretty, FNP  Assessment/Plan: Principal Problem:   TIA (transient ischemic attack) Active Problems:   Hypertension    . TIA (transient ischemic attack): Presented with right sided facial weakness and numbness, the symptoms have completely resolved. Currently without any focal deficits. MRI of the brain was negative. CT of the head is negative. Continue aspirin 325 mg a day. Neurology consulted and did recommend a full workup 2-D echo and carotid Doppler pending.  LDL 108, triglycerides 274 Will start the patient on a statin Follow results of 2-D echo, carotid Doppler, PT/OT eval Anticipate discharge tomorrow  . Hypertension: Continue cautiously with lisinopril starting tomorrow-holding parameters in place.   Code Status: full Family Communication: family updated about patient's clinical progress Disposition Plan:  Anticipate discharge tomorrow   Brief narrative: Eugene Mitchell is an 39 y.o. male history of hypertension presenting following an episode of numbness and weakness involving the right side of his face at 9:30 AM today. Numbness lasted for a few minutes. She'll droop reportedly lasted for about half an hour then resolved. He had no involvement of right upper and lower extremity. He has no previous history of stroke nor TIA. He has not been on antiplatelet therapy. CT scan of his head showed no acute intracranial abnormality. NIH stroke score was 0 at the time of this evaluation. Patient is being admitted for stroke risk assessment.  Consultants: Neurology  Procedures:  None  Antibiotics: None  HPI/Subjective: No gross focal neurologic deficits, blood pressure soft, states that his symptoms of numbness and weakness of the right side have resolved  Objective: Filed Vitals:   10/17/14 0634 10/17/14 0755 10/17/14 1010 10/17/14 1020  BP: 95/84 123/88  118/84 134/83  Pulse: 65 91 89   Temp: 98.2 F (36.8 C) 98.1 F (36.7 C) 98.1 F (36.7 C)   TempSrc: Oral Oral Oral   Resp: 16  17   Height:      Weight:      SpO2: 95% 96% 94%     Intake/Output Summary (Last 24 hours) at 10/17/14 1058 Last data filed at 10/17/14 1030  Gross per 24 hour  Intake    360 ml  Output      0 ml  Net    360 ml    Exam:  General: No acute respiratory distress Lungs: Clear to auscultation bilaterally without wheezes or crackles Cardiovascular: Regular rate and rhythm without murmur gallop or rub normal S1 and S2 Abdomen: Nontender, nondistended, soft, bowel sounds positive, no rebound, no ascites, no appreciable mass Extremities: No significant cyanosis, clubbing, or edema bilateral lower extremities      Data Reviewed: Basic Metabolic Panel:  Recent Labs Lab 10/16/14 1220  NA 141  K 3.8  CL 104  CO2 28  GLUCOSE 84  BUN 7  CREATININE 0.76  CALCIUM 9.8    Liver Function Tests:  Recent Labs Lab 10/16/14 1220  AST 21  ALT 38  ALKPHOS 74  BILITOT 0.7  PROT 7.3  ALBUMIN 4.4   No results for input(s): LIPASE, AMYLASE in the last 168 hours. No results for input(s): AMMONIA in the last 168 hours.  CBC:  Recent Labs Lab 10/16/14 1220  WBC 7.2  NEUTROABS 4.7  HGB 15.0  HCT 44.2  MCV 85.5  PLT 199    Cardiac Enzymes: No results for input(s): CKTOTAL, CKMB, CKMBINDEX, TROPONINI in the last 168 hours. BNP (last 3 results)  No results for input(s): BNP in the last 8760 hours.  ProBNP (last 3 results) No results for input(s): PROBNP in the last 8760 hours.    CBG:  Recent Labs Lab 10/16/14 1328  GLUCAP 100*    No results found for this or any previous visit (from the past 240 hour(s)).   Studies: Ct Head (brain) Wo Contrast  10/16/2014   CLINICAL DATA:  Right-sided facial droop, now resolved  EXAM: CT HEAD WITHOUT CONTRAST  TECHNIQUE: Contiguous axial images were obtained from the base of the skull through the  vertex without intravenous contrast.  COMPARISON:  None.  FINDINGS: Punctate calcifications within the left basal ganglia. Gray-white differentiation is otherwise well maintained. No CT evidence of acute large territory infarct. No intraparenchymal or extra-axial mass or hemorrhage. Normal size and configuration of the ventricles and basilar cisterns. No midline shift. Minimal intracranial atherosclerosis.  Scattered polypoid mucosal thickening on following the left maxillary and right sphenoid sinus. The remaining paranasal sinuses and mastoid air cells are normally aerated. No air-fluid levels. Regional soft tissues appear normal. No displaced calvarial fracture.  IMPRESSION: Negative noncontrast head CT.   Electronically Signed   By: Sandi Mariscal M.D.   On: 10/16/2014 11:42   Mri Brain Without Contrast  10/16/2014   CLINICAL DATA:  39 year old male with transient ischemic attack. Right facial numbness and droop lasting 5 minutes at 0930 hours today. Current history of hypertension. Initial encounter.  EXAM: MRI HEAD WITHOUT CONTRAST  MRA HEAD WITHOUT CONTRAST  TECHNIQUE: Multiplanar, multiecho pulse sequences of the brain and surrounding structures were obtained without intravenous contrast. Angiographic images of the head were obtained using MRA technique without contrast.  COMPARISON:  Head CT without contrast 1144 hours today.  FINDINGS: MRI HEAD FINDINGS  Cerebral volume is normal. Major intracranial vascular flow voids are preserved. Dominant appearing distal right vertebral artery. No restricted diffusion to suggest acute infarction. No midline shift, mass effect, evidence of mass lesion, ventriculomegaly, extra-axial collection or acute intracranial hemorrhage. Cervicomedullary junction and pituitary are within normal limits. Negative visualized cervical spine.  Pearline Cables and white matter signal is within normal limits for age throughout the brain. No cortical encephalomalacia. Negative noncontrast cavernous  sinus. Visible internal auditory structures appear normal. Mastoids are clear. Normal stylomastoid foramina.  Chronic right lamina papyracea fracture. Along the roof of the right sphenoid sinus there is a small 7 mm diameter semi circular area of signal which is isointense to the adjacent right inferior frontal gyrus cortex (series 11, image 23). No fluid in the paranasal sinuses. There is mild maxillary sinus mucosal thickening in the alveolar recesses greater on the right.  Visualized orbit soft tissues are within normal limits. Negative scalp soft tissues. Normal bone marrow signal.  MRA HEAD FINDINGS  Antegrade flow in the posterior circulation, dominant distal right vertebral artery. Normal PICA origins. The distal left vertebral artery functionally terminates in PICA. Patent basilar artery without stenosis. AICA and SCA origins are within normal limits. Fetal type bilateral PCA origins. Bilateral PCA branches are within normal limits.  Antegrade flow in both ICA siphons. No siphon stenosis. Ophthalmic and posterior communicating artery origins are within normal limits. Normal carotid termini, MCA and ACA origins. Anterior communicating artery and visualized bilateral ACA branches are within normal limits. Visualized bilateral MCA branches are within normal limits.  IMPRESSION: 1.  No acute intracranial abnormality. 2. Small 7 mm area of signal along the roof of the right sphenoid sinus appears isointense to gray matter. Therefore, although this  might reflect a small mucous retention cyst it is difficult to exclude a small sphenoid sinus encephalocele. No paranasal sinus fluid identified to suggest an associated CSF leak. 3. Otherwise normal for age noncontrast MRI appearance of the brain. 4.  Negative intracranial MRA.   Electronically Signed   By: Genevie Ann M.D.   On: 10/16/2014 16:09   Mr Jodene Nam Head/brain Wo Cm  10/16/2014   CLINICAL DATA:  39 year old male with transient ischemic attack. Right facial  numbness and droop lasting 5 minutes at 0930 hours today. Current history of hypertension. Initial encounter.  EXAM: MRI HEAD WITHOUT CONTRAST  MRA HEAD WITHOUT CONTRAST  TECHNIQUE: Multiplanar, multiecho pulse sequences of the brain and surrounding structures were obtained without intravenous contrast. Angiographic images of the head were obtained using MRA technique without contrast.  COMPARISON:  Head CT without contrast 1144 hours today.  FINDINGS: MRI HEAD FINDINGS  Cerebral volume is normal. Major intracranial vascular flow voids are preserved. Dominant appearing distal right vertebral artery. No restricted diffusion to suggest acute infarction. No midline shift, mass effect, evidence of mass lesion, ventriculomegaly, extra-axial collection or acute intracranial hemorrhage. Cervicomedullary junction and pituitary are within normal limits. Negative visualized cervical spine.  Pearline Cables and white matter signal is within normal limits for age throughout the brain. No cortical encephalomalacia. Negative noncontrast cavernous sinus. Visible internal auditory structures appear normal. Mastoids are clear. Normal stylomastoid foramina.  Chronic right lamina papyracea fracture. Along the roof of the right sphenoid sinus there is a small 7 mm diameter semi circular area of signal which is isointense to the adjacent right inferior frontal gyrus cortex (series 11, image 23). No fluid in the paranasal sinuses. There is mild maxillary sinus mucosal thickening in the alveolar recesses greater on the right.  Visualized orbit soft tissues are within normal limits. Negative scalp soft tissues. Normal bone marrow signal.  MRA HEAD FINDINGS  Antegrade flow in the posterior circulation, dominant distal right vertebral artery. Normal PICA origins. The distal left vertebral artery functionally terminates in PICA. Patent basilar artery without stenosis. AICA and SCA origins are within normal limits. Fetal type bilateral PCA origins.  Bilateral PCA branches are within normal limits.  Antegrade flow in both ICA siphons. No siphon stenosis. Ophthalmic and posterior communicating artery origins are within normal limits. Normal carotid termini, MCA and ACA origins. Anterior communicating artery and visualized bilateral ACA branches are within normal limits. Visualized bilateral MCA branches are within normal limits.  IMPRESSION: 1.  No acute intracranial abnormality. 2. Small 7 mm area of signal along the roof of the right sphenoid sinus appears isointense to gray matter. Therefore, although this might reflect a small mucous retention cyst it is difficult to exclude a small sphenoid sinus encephalocele. No paranasal sinus fluid identified to suggest an associated CSF leak. 3. Otherwise normal for age noncontrast MRI appearance of the brain. 4.  Negative intracranial MRA.   Electronically Signed   By: Genevie Ann M.D.   On: 10/16/2014 16:09    Scheduled Meds: .  stroke: mapping our early stages of recovery book   Does not apply Once  . aspirin  325 mg Oral Daily  . heparin  5,000 Units Subcutaneous 3 times per day  . lisinopril  10 mg Oral Daily  . sodium chloride  3 mL Intravenous Q12H   Continuous Infusions:   Principal Problem:   TIA (transient ischemic attack) Active Problems:   Hypertension    Time spent: 40 minutes   Ciro Tashiro  [facility] Pager [phone]. If 7PM-7AM, please contact night-coverage at [email or web address], password [facility] 10/17/2014, 10:58 AM  LOS: 1 day

## 2014-10-18 DIAGNOSIS — E785 Hyperlipidemia, unspecified: Secondary | ICD-10-CM | POA: Diagnosis not present

## 2014-10-18 DIAGNOSIS — G459 Transient cerebral ischemic attack, unspecified: Secondary | ICD-10-CM | POA: Diagnosis not present

## 2014-10-18 DIAGNOSIS — I10 Essential (primary) hypertension: Secondary | ICD-10-CM | POA: Diagnosis not present

## 2014-10-18 LAB — COMPREHENSIVE METABOLIC PANEL
ALT: 48 U/L (ref 0–53)
AST: 23 U/L (ref 0–37)
Albumin: 4 g/dL (ref 3.5–5.2)
Alkaline Phosphatase: 72 U/L (ref 39–117)
Anion gap: 9 (ref 5–15)
BUN: 9 mg/dL (ref 6–23)
CO2: 28 mmol/L (ref 19–32)
Calcium: 9.3 mg/dL (ref 8.4–10.5)
Chloride: 101 mmol/L (ref 96–112)
Creatinine, Ser: 0.91 mg/dL (ref 0.50–1.35)
GLUCOSE: 95 mg/dL (ref 70–99)
POTASSIUM: 4.2 mmol/L (ref 3.5–5.1)
SODIUM: 138 mmol/L (ref 135–145)
Total Bilirubin: 0.9 mg/dL (ref 0.3–1.2)
Total Protein: 7.1 g/dL (ref 6.0–8.3)

## 2014-10-18 LAB — HEMOGLOBIN A1C
Hgb A1c MFr Bld: 5.7 % — ABNORMAL HIGH (ref 4.8–5.6)
Mean Plasma Glucose: 117 mg/dL

## 2014-10-18 MED ORDER — ASPIRIN 325 MG PO TABS: 325.0000 mg | ORAL_TABLET | Freq: Every day | ORAL | Status: DC

## 2014-10-18 MED ORDER — ATORVASTATIN CALCIUM 20 MG PO TABS: 20.0000 mg | ORAL_TABLET | Freq: Every day | ORAL | Status: DC

## 2014-10-18 NOTE — Progress Notes (Signed)
  Echocardiogram 2D Echocardiogram has been performed.  Diamond Nickel 10/18/2014, 8:56 AM

## 2014-10-18 NOTE — Progress Notes (Signed)
Discharge orders received, pt for discharge home today , IV and telemetry D/C .  D/C instructions and Rx given with verbalized understanding.  Follow up appointments for PCP and Stroke Clinic made prior to D/C. Family at bedside to assist pt with discharge. Staff brought pt downstairs via wheelchair.

## 2014-10-18 NOTE — Progress Notes (Signed)
STROKE TEAM PROGRESS NOTE   HISTORY Eugene Mitchell is an 39 y.o. male history of hypertension presenting following an episode of numbness and weakness involving the right side of his face at 9:30 AM today 10/16/2014 ([facility]). Numbness lasted for a few minutes. Facial droop reportedly lasted for about half an hour then resolved. He had no involvement of right upper and lower extremity. He has no previous history of stroke nor TIA. He has not been on antiplatelet therapy. CT scan of his head showed no acute intracranial abnormality. NIH stroke score was 0 at the time of this evaluation. Patient is being admitted for stroke risk assessment. MRankin: 0 Patient was not administered TPA secondary to deficits resolved. He was admitted for further evaluation and treatment.   SUBJECTIVE (INTERVAL HISTORY) Doing well. Stroke workup completed anteriorly. No new complaints. Ready for discharge home   OBJECTIVE Temp:  [97.7 F (36.5 C)-98.3 F (36.8 C)] 98.1 F (36.7 C) (03/23 0627) Pulse Rate:  [65-89] 73 (03/23 0627) Cardiac Rhythm:  [-] Heart block (03/22 2100) Resp:  [16-18] 18 (03/23 0627) BP: (102-167)/(63-87) 102/66 mmHg (03/23 0627) SpO2:  [94 %-96 %] 96 % (03/23 0627)   Recent Labs Lab 10/16/14 1328  GLUCAP 100*    Recent Labs Lab 10/16/14 1220 10/18/14 0610  NA 141 138  K 3.8 4.2  CL 104 101  CO2 28 28  GLUCOSE 84 95  BUN 7 9  CREATININE 0.76 0.91  CALCIUM 9.8 9.3    Recent Labs Lab 10/16/14 1220 10/18/14 0610  AST 21 23  ALT 38 48  ALKPHOS 74 72  BILITOT 0.7 0.9  PROT 7.3 7.1  ALBUMIN 4.4 4.0    Recent Labs Lab 10/16/14 1220  WBC 7.2  NEUTROABS 4.7  HGB 15.0  HCT 44.2  MCV 85.5  PLT 199   No results for input(s): CKTOTAL, CKMB, CKMBINDEX, TROPONINI in the last 168 hours.  Recent Labs  10/16/14 1220  LABPROT 12.8  INR 0.95   No results for input(s): COLORURINE, LABSPEC, PHURINE, GLUCOSEU, HGBUR, BILIRUBINUR, KETONESUR, PROTEINUR, UROBILINOGEN,  NITRITE, LEUKOCYTESUR in the last 72 hours.  Invalid input(s): APPERANCEUR     Component Value Date/Time   CHOL 199 10/17/2014 0630   TRIG 274* 10/17/2014 0630   HDL 36* 10/17/2014 0630   CHOLHDL 5.5 10/17/2014 0630   VLDL 55* 10/17/2014 0630   LDLCALC 108* 10/17/2014 0630   Lab Results  Component Value Date   HGBA1C 5.7* 10/16/2014   No results found for: LABOPIA, COCAINSCRNUR, LABBENZ, AMPHETMU, THCU, LABBARB  No results for input(s): ETH in the last 168 hours.  Ct Head (brain) Wo Contrast  10/16/2014   CLINICAL DATA:  Right-sided facial droop, now resolved  EXAM: CT HEAD WITHOUT CONTRAST  TECHNIQUE: Contiguous axial images were obtained from the base of the skull through the vertex without intravenous contrast.  COMPARISON:  None.  FINDINGS: Punctate calcifications within the left basal ganglia. Gray-white differentiation is otherwise well maintained. No CT evidence of acute large territory infarct. No intraparenchymal or extra-axial mass or hemorrhage. Normal size and configuration of the ventricles and basilar cisterns. No midline shift. Minimal intracranial atherosclerosis.  Scattered polypoid mucosal thickening on following the left maxillary and right sphenoid sinus. The remaining paranasal sinuses and mastoid air cells are normally aerated. No air-fluid levels. Regional soft tissues appear normal. No displaced calvarial fracture.  IMPRESSION: Negative noncontrast head CT.   Electronically Signed   By: Sandi Mariscal M.D.   On: 10/16/2014 11:42  Mri Brain Without Contrast  10/16/2014   CLINICAL DATA:  39 year old male with transient ischemic attack. Right facial numbness and droop lasting 5 minutes at 0930 hours today. Current history of hypertension. Initial encounter.  EXAM: MRI HEAD WITHOUT CONTRAST  MRA HEAD WITHOUT CONTRAST  TECHNIQUE: Multiplanar, multiecho pulse sequences of the brain and surrounding structures were obtained without intravenous contrast. Angiographic images of the  head were obtained using MRA technique without contrast.  COMPARISON:  Head CT without contrast 1144 hours today.  FINDINGS: MRI HEAD FINDINGS  Cerebral volume is normal. Major intracranial vascular flow voids are preserved. Dominant appearing distal right vertebral artery. No restricted diffusion to suggest acute infarction. No midline shift, mass effect, evidence of mass lesion, ventriculomegaly, extra-axial collection or acute intracranial hemorrhage. Cervicomedullary junction and pituitary are within normal limits. Negative visualized cervical spine.  Pearline Cables and white matter signal is within normal limits for age throughout the brain. No cortical encephalomalacia. Negative noncontrast cavernous sinus. Visible internal auditory structures appear normal. Mastoids are clear. Normal stylomastoid foramina.  Chronic right lamina papyracea fracture. Along the roof of the right sphenoid sinus there is a small 7 mm diameter semi circular area of signal which is isointense to the adjacent right inferior frontal gyrus cortex (series 11, image 23). No fluid in the paranasal sinuses. There is mild maxillary sinus mucosal thickening in the alveolar recesses greater on the right.  Visualized orbit soft tissues are within normal limits. Negative scalp soft tissues. Normal bone marrow signal.  MRA HEAD FINDINGS  Antegrade flow in the posterior circulation, dominant distal right vertebral artery. Normal PICA origins. The distal left vertebral artery functionally terminates in PICA. Patent basilar artery without stenosis. AICA and SCA origins are within normal limits. Fetal type bilateral PCA origins. Bilateral PCA branches are within normal limits.  Antegrade flow in both ICA siphons. No siphon stenosis. Ophthalmic and posterior communicating artery origins are within normal limits. Normal carotid termini, MCA and ACA origins. Anterior communicating artery and visualized bilateral ACA branches are within normal limits. Visualized  bilateral MCA branches are within normal limits.  IMPRESSION: 1.  No acute intracranial abnormality. 2. Small 7 mm area of signal along the roof of the right sphenoid sinus appears isointense to gray matter. Therefore, although this might reflect a small mucous retention cyst it is difficult to exclude a small sphenoid sinus encephalocele. No paranasal sinus fluid identified to suggest an associated CSF leak. 3. Otherwise normal for age noncontrast MRI appearance of the brain. 4.  Negative intracranial MRA.   Electronically Signed   By: Genevie Ann M.D.   On: 10/16/2014 16:09   Mr Jodene Nam Head/brain Wo Cm  10/16/2014   CLINICAL DATA:  39 year old male with transient ischemic attack. Right facial numbness and droop lasting 5 minutes at 0930 hours today. Current history of hypertension. Initial encounter.  EXAM: MRI HEAD WITHOUT CONTRAST  MRA HEAD WITHOUT CONTRAST  TECHNIQUE: Multiplanar, multiecho pulse sequences of the brain and surrounding structures were obtained without intravenous contrast. Angiographic images of the head were obtained using MRA technique without contrast.  COMPARISON:  Head CT without contrast 1144 hours today.  FINDINGS: MRI HEAD FINDINGS  Cerebral volume is normal. Major intracranial vascular flow voids are preserved. Dominant appearing distal right vertebral artery. No restricted diffusion to suggest acute infarction. No midline shift, mass effect, evidence of mass lesion, ventriculomegaly, extra-axial collection or acute intracranial hemorrhage. Cervicomedullary junction and pituitary are within normal limits. Negative visualized cervical spine.  Pearline Cables and white matter  signal is within normal limits for age throughout the brain. No cortical encephalomalacia. Negative noncontrast cavernous sinus. Visible internal auditory structures appear normal. Mastoids are clear. Normal stylomastoid foramina.  Chronic right lamina papyracea fracture. Along the roof of the right sphenoid sinus there is a small  7 mm diameter semi circular area of signal which is isointense to the adjacent right inferior frontal gyrus cortex (series 11, image 23). No fluid in the paranasal sinuses. There is mild maxillary sinus mucosal thickening in the alveolar recesses greater on the right.  Visualized orbit soft tissues are within normal limits. Negative scalp soft tissues. Normal bone marrow signal.  MRA HEAD FINDINGS  Antegrade flow in the posterior circulation, dominant distal right vertebral artery. Normal PICA origins. The distal left vertebral artery functionally terminates in PICA. Patent basilar artery without stenosis. AICA and SCA origins are within normal limits. Fetal type bilateral PCA origins. Bilateral PCA branches are within normal limits.  Antegrade flow in both ICA siphons. No siphon stenosis. Ophthalmic and posterior communicating artery origins are within normal limits. Normal carotid termini, MCA and ACA origins. Anterior communicating artery and visualized bilateral ACA branches are within normal limits. Visualized bilateral MCA branches are within normal limits.  IMPRESSION: 1.  No acute intracranial abnormality. 2. Small 7 mm area of signal along the roof of the right sphenoid sinus appears isointense to gray matter. Therefore, although this might reflect a small mucous retention cyst it is difficult to exclude a small sphenoid sinus encephalocele. No paranasal sinus fluid identified to suggest an associated CSF leak. 3. Otherwise normal for age noncontrast MRI appearance of the brain. 4.  Negative intracranial MRA.   Electronically Signed   By: Genevie Ann M.D.   On: 10/16/2014 16:09   Carotid Doppler  There is 1-39% bilateral ICA stenosis. Vertebral artery flow is antegrade.    2D Echocardiogram  EF 55-60% with no source of embolus.    PHYSICAL EXAM Obese middle-aged Caucasian male currently not in distress. . Afebrile. Head is nontraumatic. Neck is supple without bruit.    Cardiac exam no murmur or gallop.  Lungs are clear to auscultation. Distal pulses are well felt. Neurological Exam ;  Awake  Alert oriented x 3. Normal speech and language.eye movements full without nystagmus.fundi were not visualized. Vision acuity and fields appear normal. Hearing is normal. Palatal movements are normal. Face symmetric. Tongue midline. Normal strength, tone, reflexes and coordination. Normal sensation. Gait deferred.   ASSESSMENT/PLAN Eugene Mitchell is a 39 y.o. male with history of hypertension presenting with transient weakness and numbness involving right side of the face. He did not receive IV t-PA due to resolved symptoms.   Transient R facial weakness and numbness, Possible TIA  Resultant  Numbness has resolved  MRI  No acute stroke  MRA  Unremarkable   Carotid Doppler  No significant stenosis   2D Echo  No source of embolus   HgbA1c 5.7  Heparin 5000 units sq tid for VTE prophylaxis  Diet Heart thin liquids  no antithrombotic prior to admission, now on aspirin 325 mg orally every day. Given risk factors, would continue aspirin at discharge  Patient counseled to be compliant with his antithrombotic medications  Ongoing aggressive stroke risk factor management  Therapy recommendations:  No therapy needs  Disposition:  Return home once workup completed  Can follow up with Dr. Leonie Man in 2 months (order written)  Hypertension  Home meds:   lisinopril  Stable  Patient counseled to be  compliant with his blood pressure medications  Hyperlipidemia  Home meds:  No statin  LDL 108  Statin added this admission  Continue statin at discharge  Other Stroke Risk Factors  Former Cigarette smoker, quit smoking   Obesity, Body mass index is 34.98 kg/(m^2).   Family hx stroke (father)  Other Active Problems  Depression  Hospital day # [place] for Pager information 10/18/2014 9:14 AM   I have personally examined this  patient, reviewed notes, independently viewed imaging studies, participated in medical decision making and plan of care. I have made any additions or clarifications directly to the above note. Agree with note above. Stroke team will sign off. Likely discharge home later today. Follow-up as an outpatient in 2 months.  Antony Contras, MD Medical Director [facility] Pager: [phone] 10/18/2014 3:47 PM   To contact Stroke Continuity provider, please refer to [email or web address] After hours, contact General Neurology

## 2014-10-18 NOTE — Progress Notes (Signed)
Preliminary Report: Carotid duplex completed. Bilateral ICA 1-39% stenosis. [place]

## 2014-10-18 NOTE — Discharge Summary (Signed)
Physician Discharge Summary  Eugene Mitchell GGY:[id number] DOB: [birth year] DOA: 10/16/2014  PCP: Chevis Pretty, FNP  Admit date: 10/16/2014 Discharge date: 10/18/2014  Time spent: 65 minutes  Recommendations for Outpatient Follow-up:  1. Follow-up with Chevis Pretty, FNP/Dr. Melford Aase on 11/01/2014 for hospital follow-up. 2. Follow-up with Dr. Leonie Man of neurology on 12/13/2014.  Discharge Diagnoses:  Principal Problem:   TIA (transient ischemic attack) Active Problems:   Hypertension   Hyperlipidemia   Discharge Condition: Stable and improved  Diet recommendation: Heart healthy  Filed Weights   10/16/14 1059 10/16/14 1924  Weight: 104.327 kg (230 lb) 104.327 kg (230 lb)    History of present illness:  Eugene Mitchell is a 39 y.o. male with a Past Medical History of hypertension who presented on the day of admission with the transient right facial numbness, facial drooping lasting 5 minutes around 9:30 on the morning of admission. Per patient, he was at work when his coworkers noted him to have right facial droop, at the same time he was having numbness on the right side of his face. Coworkers also noted that he was drooling from the right side of his mouth. Patient denied any right upper, right lower extremity weakness. Apparently speech was not affected during this time. These symptoms lasted for approximately 5 minutes or so, following which patient was back to his usual self. He was subsequently brought to the emergency room, and the hospitalist service was asked to admit this patient for further evaluation and treatment Patient denied any recent fever, headache, chest pain, shortness of breath, nausea, vomiting, diarrhea or abdominal pain Patient has a family history-father had a CVA. He did not smoke   Hospital Course:  #1 transient right facial weakness and numbness, possible TIA Patient had presented with right facial numbness and weakness as well as a facial  droop which resolved. CT of the head was done which was negative. MRI/MRA of the head was unremarkable. Due to resolution of patient's deficits TPA was not administered on admission. Patient was admitted to telemetry and stroke workup undertaken. Patient was also started on a full dose aspirin. 2-D echo which was done had a EF of 55-60% with no wall motion abnormalities and no cardiac source of emboli. Carotid Dopplers which were done had no significant ICA stenosis. Fasting lipid panel which was done had a LDL of 108 and assessed patient was started on a statin for risk factor modification. Patient was maintained on his antihypertensive medications for blood pressure control. Patient was also maintained on aspirin 325 mg daily for secondary stroke prevention. Patient be discharged in stable and improved condition and is to follow-up with PCP. Patient is also to follow-up with Dr. Leonie Man of neurology 2 months. Patient be discharged in stable and improved condition.  #2 hypertension Remained stable throughout the hospitalization patient was written on his home regimen of lisinopril.  #3 hyperlipidemia Past lipid panel which was done showed a LDL of 108. Due to patient's presenting symptoms patient was started on a statin number discharged on a statin. Outpatient follow-up.  The rest of patient's chronic medical issues remained stable throughout the hospitalization the patient be discharged in stable and improved condition.  Procedures:  CT head 10/16/2014  MRI MRA head 10/16/2014  2-D echo 10/18/2014  Carotid Dopplers 10/18/2014  Consultations:  Neurology: Dr. Nicole Kindred 10/16/2014  Discharge Exam: Filed Vitals:   10/18/14 0922  BP: 119/75  Pulse: 77  Temp: 98.4 F (36.9 C)  Resp: 18  General: NAD Cardiovascular: RRR Respiratory: CTAB  Discharge Instructions   Discharge Instructions    Ambulatory referral to Neurology    Complete by:  As directed   Please schedule post stroke  follow up in 2 months.     Diet - low sodium heart healthy    Complete by:  As directed      Discharge instructions    Complete by:  As directed   Follow up with Chevis Pretty, FNP/Dr Melford Aase in 1 week. Follow up with Dr Leonie Man 2 months     Increase activity slowly    Complete by:  As directed           Current Discharge Medication List    START taking these medications   Details  aspirin 325 MG tablet Take 1 tablet (325 mg total) by mouth daily.    atorvastatin (LIPITOR) 20 MG tablet Take 1 tablet (20 mg total) by mouth daily at 6 PM. Qty: 30 tablet, Refills: 0      CONTINUE these medications which have NOT CHANGED   Details  DULoxetine (CYMBALTA) 60 MG capsule Take 60 mg by mouth daily.    Flaxseed, Linseed, (FLAX SEED OIL) 1000 MG CAPS Take 1,000 mg by mouth daily.    lisinopril (PRINIVIL,ZESTRIL) 10 MG tablet Take 10 mg by mouth daily.       No Known Allergies Follow-up Information    Follow up with Chesley Noon, MD. Go on 11/01/2014.   Specialty:  Family Medicine   Why:  Hospital Follow Up   Contact information:   [place] [address] [phone]       Follow up with Antony Contras, MD. Go on 12/13/2014.   Specialties:  Neurology, Radiology   Why:  Stroke Clinic Follow Up   Contact information:   [address] [place] [address] [phone]        The results of significant diagnostics from this hospitalization (including imaging, microbiology, ancillary and laboratory) are listed below for reference.    Significant Diagnostic Studies: Ct Head (brain) Wo Contrast  10/16/2014   CLINICAL DATA:  Right-sided facial droop, now resolved  EXAM: CT HEAD WITHOUT CONTRAST  TECHNIQUE: Contiguous axial images were obtained from the base of the skull through the vertex without intravenous contrast.  COMPARISON:  None.  FINDINGS: Punctate calcifications within the left basal ganglia. Gray-white differentiation is otherwise  well maintained. No CT evidence of acute large territory infarct. No intraparenchymal or extra-axial mass or hemorrhage. Normal size and configuration of the ventricles and basilar cisterns. No midline shift. Minimal intracranial atherosclerosis.  Scattered polypoid mucosal thickening on following the left maxillary and right sphenoid sinus. The remaining paranasal sinuses and mastoid air cells are normally aerated. No air-fluid levels. Regional soft tissues appear normal. No displaced calvarial fracture.  IMPRESSION: Negative noncontrast head CT.   Electronically Signed   By: Sandi Mariscal M.D.   On: 10/16/2014 11:42   Mri Brain Without Contrast  10/16/2014   CLINICAL DATA:  39 year old male with transient ischemic attack. Right facial numbness and droop lasting 5 minutes at 0930 hours today. Current history of hypertension. Initial encounter.  EXAM: MRI HEAD WITHOUT CONTRAST  MRA HEAD WITHOUT CONTRAST  TECHNIQUE: Multiplanar, multiecho pulse sequences of the brain and surrounding structures were obtained without intravenous contrast. Angiographic images of the head were obtained using MRA technique without contrast.  COMPARISON:  Head CT without contrast 1144 hours today.  FINDINGS: MRI HEAD FINDINGS  Cerebral volume  is normal. Major intracranial vascular flow voids are preserved. Dominant appearing distal right vertebral artery. No restricted diffusion to suggest acute infarction. No midline shift, mass effect, evidence of mass lesion, ventriculomegaly, extra-axial collection or acute intracranial hemorrhage. Cervicomedullary junction and pituitary are within normal limits. Negative visualized cervical spine.  Pearline Cables and white matter signal is within normal limits for age throughout the brain. No cortical encephalomalacia. Negative noncontrast cavernous sinus. Visible internal auditory structures appear normal. Mastoids are clear. Normal stylomastoid foramina.  Chronic right lamina papyracea fracture. Along the  roof of the right sphenoid sinus there is a small 7 mm diameter semi circular area of signal which is isointense to the adjacent right inferior frontal gyrus cortex (series 11, image 23). No fluid in the paranasal sinuses. There is mild maxillary sinus mucosal thickening in the alveolar recesses greater on the right.  Visualized orbit soft tissues are within normal limits. Negative scalp soft tissues. Normal bone marrow signal.  MRA HEAD FINDINGS  Antegrade flow in the posterior circulation, dominant distal right vertebral artery. Normal PICA origins. The distal left vertebral artery functionally terminates in PICA. Patent basilar artery without stenosis. AICA and SCA origins are within normal limits. Fetal type bilateral PCA origins. Bilateral PCA branches are within normal limits.  Antegrade flow in both ICA siphons. No siphon stenosis. Ophthalmic and posterior communicating artery origins are within normal limits. Normal carotid termini, MCA and ACA origins. Anterior communicating artery and visualized bilateral ACA branches are within normal limits. Visualized bilateral MCA branches are within normal limits.  IMPRESSION: 1.  No acute intracranial abnormality. 2. Small 7 mm area of signal along the roof of the right sphenoid sinus appears isointense to gray matter. Therefore, although this might reflect a small mucous retention cyst it is difficult to exclude a small sphenoid sinus encephalocele. No paranasal sinus fluid identified to suggest an associated CSF leak. 3. Otherwise normal for age noncontrast MRI appearance of the brain. 4.  Negative intracranial MRA.   Electronically Signed   By: Genevie Ann M.D.   On: 10/16/2014 16:09   Mr Jodene Nam Head/brain Wo Cm  10/16/2014   CLINICAL DATA:  39 year old male with transient ischemic attack. Right facial numbness and droop lasting 5 minutes at 0930 hours today. Current history of hypertension. Initial encounter.  EXAM: MRI HEAD WITHOUT CONTRAST  MRA HEAD WITHOUT  CONTRAST  TECHNIQUE: Multiplanar, multiecho pulse sequences of the brain and surrounding structures were obtained without intravenous contrast. Angiographic images of the head were obtained using MRA technique without contrast.  COMPARISON:  Head CT without contrast 1144 hours today.  FINDINGS: MRI HEAD FINDINGS  Cerebral volume is normal. Major intracranial vascular flow voids are preserved. Dominant appearing distal right vertebral artery. No restricted diffusion to suggest acute infarction. No midline shift, mass effect, evidence of mass lesion, ventriculomegaly, extra-axial collection or acute intracranial hemorrhage. Cervicomedullary junction and pituitary are within normal limits. Negative visualized cervical spine.  Pearline Cables and white matter signal is within normal limits for age throughout the brain. No cortical encephalomalacia. Negative noncontrast cavernous sinus. Visible internal auditory structures appear normal. Mastoids are clear. Normal stylomastoid foramina.  Chronic right lamina papyracea fracture. Along the roof of the right sphenoid sinus there is a small 7 mm diameter semi circular area of signal which is isointense to the adjacent right inferior frontal gyrus cortex (series 11, image 23). No fluid in the paranasal sinuses. There is mild maxillary sinus mucosal thickening in the alveolar recesses greater on the right.  Visualized  orbit soft tissues are within normal limits. Negative scalp soft tissues. Normal bone marrow signal.  MRA HEAD FINDINGS  Antegrade flow in the posterior circulation, dominant distal right vertebral artery. Normal PICA origins. The distal left vertebral artery functionally terminates in PICA. Patent basilar artery without stenosis. AICA and SCA origins are within normal limits. Fetal type bilateral PCA origins. Bilateral PCA branches are within normal limits.  Antegrade flow in both ICA siphons. No siphon stenosis. Ophthalmic and posterior communicating artery origins are  within normal limits. Normal carotid termini, MCA and ACA origins. Anterior communicating artery and visualized bilateral ACA branches are within normal limits. Visualized bilateral MCA branches are within normal limits.  IMPRESSION: 1.  No acute intracranial abnormality. 2. Small 7 mm area of signal along the roof of the right sphenoid sinus appears isointense to gray matter. Therefore, although this might reflect a small mucous retention cyst it is difficult to exclude a small sphenoid sinus encephalocele. No paranasal sinus fluid identified to suggest an associated CSF leak. 3. Otherwise normal for age noncontrast MRI appearance of the brain. 4.  Negative intracranial MRA.   Electronically Signed   By: Genevie Ann M.D.   On: 10/16/2014 16:09    Microbiology: No results found for this or any previous visit (from the past 240 hour(s)).   Labs: Basic Metabolic Panel:  Recent Labs Lab 10/16/14 1220 10/18/14 0610  NA 141 138  K 3.8 4.2  CL 104 101  CO2 28 28  GLUCOSE 84 95  BUN 7 9  CREATININE 0.76 0.91  CALCIUM 9.8 9.3   Liver Function Tests:  Recent Labs Lab 10/16/14 1220 10/18/14 0610  AST 21 23  ALT 38 48  ALKPHOS 74 72  BILITOT 0.7 0.9  PROT 7.3 7.1  ALBUMIN 4.4 4.0   No results for input(s): LIPASE, AMYLASE in the last 168 hours. No results for input(s): AMMONIA in the last 168 hours. CBC:  Recent Labs Lab 10/16/14 1220  WBC 7.2  NEUTROABS 4.7  HGB 15.0  HCT 44.2  MCV 85.5  PLT 199   Cardiac Enzymes: No results for input(s): CKTOTAL, CKMB, CKMBINDEX, TROPONINI in the last 168 hours. BNP: BNP (last 3 results) No results for input(s): BNP in the last 8760 hours.  ProBNP (last 3 results) No results for input(s): PROBNP in the last 8760 hours.  CBG:  Recent Labs Lab 10/16/14 1328  GLUCAP 100*       Signed:  Bobette Leyh MD [facility] 10/18/2014, 12:38 PM

## 2014-12-14 ENCOUNTER — Encounter: Payer: Self-pay | Admitting: Neurology

## 2014-12-14 ENCOUNTER — Ambulatory Visit (INDEPENDENT_AMBULATORY_CARE_PROVIDER_SITE_OTHER): Payer: BLUE CROSS/BLUE SHIELD | Admitting: Neurology

## 2014-12-14 VITALS — BP 133/83 | HR 73 | Ht 68.0 in | Wt 239.0 lb

## 2014-12-14 DIAGNOSIS — G459 Transient cerebral ischemic attack, unspecified: Secondary | ICD-10-CM

## 2014-12-14 DIAGNOSIS — R0683 Snoring: Secondary | ICD-10-CM

## 2014-12-14 DIAGNOSIS — E785 Hyperlipidemia, unspecified: Secondary | ICD-10-CM | POA: Diagnosis not present

## 2014-12-14 DIAGNOSIS — I10 Essential (primary) hypertension: Secondary | ICD-10-CM | POA: Diagnosis not present

## 2014-12-14 NOTE — Progress Notes (Signed)
PATIENT: Eugene Mitchell DOB: 06-23-1976  Chief Complaint  Patient presents with  . Transient Ischemic Attack    He had an episode of increased BP (170/110) and right-sided facial numbness that occurred while he was at work.  The symptoms prompted him to go the ED where he was told he had a TIA.  Says all his tests/scans were normal at the hospital.    [place] 39 years old left-handed male, referred by his primary care physician Dr. Melford Aase, follow-up his hospital admission from March 21 to October 18 2014.   He had a history of hypertension, in March 20 first 2016, at his work, he had sudden onset right facial numbness, right facial weakness, lasting less than one minutes, was noted to have elevated blood pressure 170 out of 110, he was admitted to the hospital,  Laboratory showed elevated LDL 108, cholesterol 274, mild elevated A1c 5.7, MRI of the brain showed no acute stroke, MRI a of the brain was unremarkable, echocardiogram showed normal ejection fraction, no source of embolus, carotid artery showed no significant stenosis.  He is now discharged with aspirin 325 mg a day, Lipitor,  He also complains of snoring, talking out of dreams, daytime sleepiness, and fatigue, ESS as was 6, FSS was 16  REVIEW OF SYSTEMS: Full 14 system review of systems performed and notable only for memory loss, numbness, ringing ears  ALLERGIES: No Known Allergies  HOME MEDICATIONS: Current Outpatient Prescriptions  Medication Sig Dispense Refill  . aspirin 325 MG tablet Take 1 tablet (325 mg total) by mouth daily.    Marland Kitchen atorvastatin (LIPITOR) 20 MG tablet Take 1 tablet (20 mg total) by mouth daily at 6 PM. 30 tablet 0  . Flaxseed, Linseed, (FLAX SEED OIL) 1000 MG CAPS Take 1,000 mg by mouth daily.    Marland Kitchen lisinopril (PRINIVIL,ZESTRIL) 10 MG tablet Take 10 mg by mouth daily.       PAST MEDICAL HISTORY: Past Medical History  Diagnosis Date  . Hypertension   . Depression   .  TIA (transient ischemic attack)   . Hypercholesteremia     PAST SURGICAL HISTORY: Past Surgical History  Procedure Laterality Date  . Cholecystectomy      FAMILY HISTORY: Family History  Problem Relation Age of Onset  . Stroke Father   . Coronary artery disease Father   . Thyroid disease Mother     SOCIAL HISTORY:  History   Social History  . Marital Status: Single    Spouse Name: N/A  . Number of Children: 2  . Years of Education: 12   Occupational History  . Physiological scientist    Social History Main Topics  . Smoking status: Former Research scientist (life sciences)  . Smokeless tobacco: Not on file  . Alcohol Use: No  . Drug Use: No  . Sexual Activity:    Partners: Male   Other Topics Concern  . Not on file   Social History Narrative   Lives at home with his parents.   Left-handed.   3-6 cups caffeine daily.    PHYSICAL EXAM   Filed Vitals:   12/14/14 0950  BP: 133/83  Pulse: 73  Height: 5\' 8"  (1.727 m)  Weight: 239 lb (108.41 kg)    Not recorded      Body mass index is 36.35 kg/(m^2).  PHYSICAL EXAMNIATION:  Gen: NAD, conversant, well nourised, obese, well groomed  Cardiovascular: Regular rate rhythm, no peripheral edema, warm, nontender. Eyes: Conjunctivae clear without exudates or hemorrhage Neck: Supple, no carotid bruise. Pulmonary: Clear to auscultation bilaterally   NEUROLOGICAL EXAM:  MENTAL STATUS: Speech:    Speech is normal; fluent and spontaneous with normal comprehension.  Cognition:    The patient is oriented to person, place, and time;     recent and remote memory intact;     language fluent;     normal attention, concentration,     fund of knowledge.  CRANIAL NERVES: CN II: Visual fields are full to confrontation. Fundoscopic exam is normal with sharp discs and no vascular changes. Venous pulsations are present bilaterally. Pupils are 4 mm and briskly reactive to light. Visual acuity is 20/20 bilaterally. CN III, IV, VI:  extraocular movement are normal. No ptosis. CN V: Facial sensation is intact to pinprick in all 3 divisions bilaterally. Corneal responses are intact.  CN VII: Face is symmetric with normal eye closure and smile. CN VIII: Hearing is normal to rubbing fingers CN IX, X: Palate elevates symmetrically. Phonation is normal. CN XI: Head turning and shoulder shrug are intact CN XII: Tongue is midline with normal movements and no atrophy.  MOTOR: There is no pronator drift of out-stretched arms. Muscle bulk and tone are normal. Muscle strength is normal.  REFLEXES: Reflexes are 2+ and symmetric at the biceps, triceps, knees, and ankles. Plantar responses are flexor.  SENSORY: Light touch, pinprick, position sense, and vibration sense are intact in fingers and toes.  COORDINATION: Rapid alternating movements and fine finger movements are intact. There is no dysmetria on finger-to-nose and heel-knee-shin. There are no abnormal or extraneous movements.   GAIT/STANCE: Posture is normal. Gait is steady with normal steps, base, arm swing, and turning. Heel and toe walking are normal. Tandem gait is normal.  Romberg is absent.   DIAGNOSTIC DATA (LABS, IMAGING, TESTING) - I reviewed patient records, labs, notes, testing and imaging myself where available.  Lab Results  Component Value Date   WBC 7.2 10/16/2014   HGB 15.0 10/16/2014   HCT 44.2 10/16/2014   MCV 85.5 10/16/2014   PLT 199 10/16/2014      Component Value Date/Time   NA 138 10/18/2014 0610   K 4.2 10/18/2014 0610   CL 101 10/18/2014 0610   CO2 28 10/18/2014 0610   GLUCOSE 95 10/18/2014 0610   BUN 9 10/18/2014 0610   CREATININE 0.91 10/18/2014 0610   CALCIUM 9.3 10/18/2014 0610   PROT 7.1 10/18/2014 0610   ALBUMIN 4.0 10/18/2014 0610   AST 23 10/18/2014 0610   ALT 48 10/18/2014 0610   ALKPHOS 72 10/18/2014 0610   BILITOT 0.9 10/18/2014 0610   GFRNONAA >90 10/18/2014 0610   GFRAA >90 10/18/2014 0610   Lab Results    Component Value Date   CHOL 199 10/17/2014   HDL 36* 10/17/2014   LDLCALC 108* 10/17/2014   TRIG 274* 10/17/2014   CHOLHDL 5.5 10/17/2014   Lab Results  Component Value Date   HGBA1C 5.7* 10/16/2014   No results found for: VITAMINB12 No results found for: TSH    ASSESSMENT AND PLAN  MURRIEL EIDEM is a 39 y.o. male  with vascular risk factor of hypertension, hyperlipidemia, mild obesity, mild elevated A1c 5.7, presenting with right facial paresthesia, transient weakness, most consistent with TIA, normal MRI of the brain, MRA of the brain,  1, continue aspirin 325 mg every day  2, optimize vascular risk factor control, LDL less  than 100, blood pressure less than 130/80 3. He also has symptoms suggestive of obstructive sleep apnea, I have suggested moderate exercise,    return to clinic for new issues  Marcial Pacas, M.D. Ph.D.  [facility] [address] [place], [place] [address] Ph: [phone] Fax: [phone]

## 2016-05-07 ENCOUNTER — Encounter: Payer: Self-pay | Admitting: Physician Assistant

## 2016-05-07 ENCOUNTER — Ambulatory Visit (INDEPENDENT_AMBULATORY_CARE_PROVIDER_SITE_OTHER): Payer: BLUE CROSS/BLUE SHIELD | Admitting: Physician Assistant

## 2016-05-07 ENCOUNTER — Ambulatory Visit (INDEPENDENT_AMBULATORY_CARE_PROVIDER_SITE_OTHER): Payer: BLUE CROSS/BLUE SHIELD

## 2016-05-07 ENCOUNTER — Encounter (INDEPENDENT_AMBULATORY_CARE_PROVIDER_SITE_OTHER): Payer: Self-pay

## 2016-05-07 VITALS — BP 130/82 | HR 85 | Temp 97.6°F | Ht 68.0 in | Wt 250.4 lb

## 2016-05-07 DIAGNOSIS — S300XXA Contusion of lower back and pelvis, initial encounter: Secondary | ICD-10-CM

## 2016-05-07 DIAGNOSIS — W01198A Fall on same level from slipping, tripping and stumbling with subsequent striking against other object, initial encounter: Secondary | ICD-10-CM

## 2016-05-07 DIAGNOSIS — W108XXA Fall (on) (from) other stairs and steps, initial encounter: Secondary | ICD-10-CM

## 2016-05-07 DIAGNOSIS — M5137 Other intervertebral disc degeneration, lumbosacral region: Secondary | ICD-10-CM | POA: Diagnosis not present

## 2016-05-07 DIAGNOSIS — W19XXXA Unspecified fall, initial encounter: Secondary | ICD-10-CM

## 2016-05-07 MED ORDER — HYDROCODONE-ACETAMINOPHEN 7.5-325 MG PO TABS: 1.0000 | ORAL_TABLET | Freq: Four times a day (QID) | ORAL | 0 refills | Status: DC | PRN

## 2016-05-07 NOTE — Patient Instructions (Signed)

## 2016-05-07 NOTE — Progress Notes (Signed)
BP 130/82   Pulse 85   Temp 97.6 F (36.4 C) (Oral)   Ht 5\' 8"  (1.727 m)   Wt 250 lb 6.4 oz (113.6 kg)   BMI 38.07 kg/m    Subjective:    Patient ID: Eugene Mitchell, male    DOB: 1975/10/02, 40 y.o.   MRN: [id number]  HPI: Eugene Mitchell is a 40 y.o. male presenting on 05/07/2016 for New Patient (Initial Visit) (Patients PCP is Dr. Melford Aase) and Fall (Patient fell down about 6 steps while in [place] ) This patient has known L5-S1 bulging disc. He has been under the care of Dr. Nelva Bush for greater than 8 years for this. He has an upcoming injection on Friday for this. His last injection was about 8 years ago. It has controlled the pain greatly. His concern today is a injury to the upper lumbar area. Approximately 2 days ago he slipped on some steps outside his feet came out from under him and he hit his upper lumbar/rib cage on the steps and fell down 6 steps. He also hit his left elbow. He has had the elbow checked by his orthopedist as he had surgery on it approximately one year ago. He continued with significant pain in the lumbar area. He cannot take anti-inflammatories related to his upcoming injection due to bleeding risk. Since we cannot take an infected at this time. He does have a muscle relaxant at home from Dr. Nelva Bush can take it is making him too sleepy.  Relevant past medical, surgical, family and social history reviewed and updated as indicated. Allergies and medications reviewed and updated.  Past Medical History:  Diagnosis Date  . Depression   . Hypercholesteremia   . Hypertension   . TIA (transient ischemic attack)     Past Surgical History:  Procedure Laterality Date  . CHOLECYSTECTOMY      Review of Systems  Constitutional: Negative.  Negative for appetite change and fatigue.  Eyes: Negative for pain and visual disturbance.  Respiratory: Negative.  Negative for cough, chest tightness, shortness of breath and wheezing.   Cardiovascular: Negative.  Negative  for chest pain, palpitations and leg swelling.  Gastrointestinal: Negative.  Negative for abdominal pain, diarrhea, nausea and vomiting.  Genitourinary: Negative.   Musculoskeletal: Positive for arthralgias, back pain and myalgias. Negative for gait problem.  Skin: Negative.  Negative for color change and rash.  Neurological: Negative.  Negative for weakness, numbness and headaches.  Psychiatric/Behavioral: Negative.       Medication List       Accurate as of 05/07/16 12:47 PM. Always use your most recent med list.          atorvastatin 20 MG tablet Commonly known as:  LIPITOR Take 1 tablet (20 mg total) by mouth daily at 6 PM.   gabapentin 300 MG capsule Commonly known as:  NEURONTIN Take 300 mg by mouth 3 (three) times daily.   HYDROcodone-acetaminophen 7.5-325 MG tablet Commonly known as:  NORCO Take 1 tablet by mouth every 6 (six) hours as needed for moderate pain.   lisinopril 10 MG tablet Commonly known as:  PRINIVIL,ZESTRIL Take 10 mg by mouth daily.          Objective:    BP 130/82   Pulse 85   Temp 97.6 F (36.4 C) (Oral)   Ht 5\' 8"  (1.727 m)   Wt 250 lb 6.4 oz (113.6 kg)   BMI 38.07 kg/m   No Known Allergies  Physical Exam  Constitutional: He appears well-developed and well-nourished. No distress.  HENT:  Head: Normocephalic and atraumatic.  Eyes: Conjunctivae and EOM are normal. Pupils are equal, round, and reactive to light.  Cardiovascular: Normal rate, regular rhythm and normal heart sounds.   Pulmonary/Chest: Effort normal and breath sounds normal. No respiratory distress.  Musculoskeletal:       Lumbar back: He exhibits decreased range of motion, tenderness, pain and spasm.       Back:       Left forearm: He exhibits tenderness, swelling and edema.  Bruising on upper lumbar and left flank  Skin: Skin is warm and dry.  Psychiatric: He has a normal mood and affect. His behavior is normal.  Nursing note and vitals reviewed.         Assessment & Plan:   1. Fall, initial encounter - DG Lumbar Spine 2-3 Views; Future - HYDROcodone-acetaminophen (NORCO) 7.5-325 MG tablet; Take 1 tablet by mouth every 6 (six) hours as needed for moderate pain.  Dispense: 60 tablet; Refill: 0  2. Lumbar contusion, initial encounter - HYDROcodone-acetaminophen (NORCO) 7.5-325 MG tablet; Take 1 tablet by mouth every 6 (six) hours as needed for moderate pain.  Dispense: 60 tablet; Refill: 0 Forward note to Dr. Nelva Bush with [facility].  3. DDD (degenerative disc disease), lumbosacral   Continue all other maintenance medications as listed above.  Follow up plan: Return if symptoms worsen or fail to improve.  Orders Placed This Encounter  Procedures  . DG Lumbar Spine 2-3 Views    Educational handout given for contusion.  Terald Sleeper PA-C [place] [address]  [place], [place] [address] [phone]   05/07/2016, 12:47 PM

## 2016-05-28 ENCOUNTER — Ambulatory Visit: Payer: BLUE CROSS/BLUE SHIELD | Admitting: Physical Therapy

## 2016-05-29 ENCOUNTER — Ambulatory Visit: Payer: BLUE CROSS/BLUE SHIELD | Attending: Orthopedic Surgery | Admitting: Physical Therapy

## 2016-05-29 DIAGNOSIS — G8929 Other chronic pain: Secondary | ICD-10-CM | POA: Diagnosis present

## 2016-05-29 DIAGNOSIS — M545 Low back pain: Secondary | ICD-10-CM | POA: Insufficient documentation

## 2016-05-29 NOTE — Therapy (Signed)
[facility] [place], [place], [address] Phone: [phone]   Fax:  [phone]  Physical Therapy Evaluation  Patient Details  Name: Eugene Mitchell MRN: [id number] Date of Birth: [birth year] Referring Provider: [name] MD.  Encounter Date: 05/29/2016      PT End of Session - 05/29/16 1558    Visit Number 1   Number of Visits 12   Date for PT Re-Evaluation 07/10/16   PT Start Time 0145   PT Stop Time 0233   PT Time Calculation (min) 48 min   Activity Tolerance Patient tolerated treatment well   Behavior During Therapy [facility] for tasks assessed/performed      Past Medical History:  Diagnosis Date  . Depression   . Hypercholesteremia   . Hypertension   . TIA (transient ischemic attack)     Past Surgical History:  Procedure Laterality Date  . CHOLECYSTECTOMY      There were no vitals filed for this visit.       Subjective Assessment - 05/29/16 1554    Subjective The patient reports the onset of low back pain in 2007 but he has been fine until this last year.Walking decreases his pain but standing increases his pain.  An X-ray revealed moderate DDD at L5-S1.  His pain radiates into his left buttock and posterior thigh.   Limitations Standing   How long can you stand comfortably? 10 minutes.   Currently in Pain? Yes   Pain Score 4    Pain Location Back   Pain Orientation Left   Pain Descriptors / Indicators Aching;Shooting   Pain Type Chronic pain   Pain Onset More than a month ago   Pain Frequency Constant   Aggravating Factors  Please see above.            [facility] PT Assessment - 05/29/16 0001      Assessment   Medical Diagnosis DDD at L5-S1.   Referring Provider [name] MD.   Onset Date/Surgical Date --  Over last year.     Precautions   Precautions None     Restrictions   Weight Bearing Restrictions No     Balance Screen   Has the patient fallen in the past 6 months Yes   How  many times? --  1.   Has the patient had a decrease in activity level because of a fear of falling?  No   Is the patient reluctant to leave their home because of a fear of falling?  No     Home Ecologist residence     Prior Function   Level of Independence Independent     Posture/Postural Control   Posture/Postural Control Postural limitations   Postural Limitations Decreased lumbar lordosis     ROM / Strength   AROM / PROM / Strength AROM;Strength     AROM   Overall AROM Comments Lumbar extension= 20 degres and flexion limited by 50%.     Strength   Overall Strength Comments Normal bilateral LE strength.     Palpation   Palpation comment Tender to palpation left of L5-S1/SIJ rehgion.     Special Tests    Special Tests Lumbar;Leg LengthTest  2+/4+ Pat DTR's; 1+/4+ Ach DTR's.   Lumbar Tests --  (-) SLR test; (-) FABER test.   Leg length test  --  Equal leg lengths.     Ambulation/Gait   Gait Comments WNL.  [facility] Adult PT Treatment/Exercise - 05/29/16 0001      Modalities   Modalities Traction     Traction   Type of Traction Lumbar   Min (lbs) 5   Max (lbs) 95   Hold Time 99   Rest Time 5   Time 15                     PT Long Term Goals - 05/29/16 1745      PT LONG TERM GOAL #1   Title Independent with a HEP.   Time 4   Period Weeks   Status New     PT LONG TERM GOAL #2   Title Eliminate left LE pain.   Time 4   Period Weeks   Status New     PT LONG TERM GOAL #3   Title Stand 30 minutes with pain not > 3/10.   Time 4   Period Weeks   Status New     PT LONG TERM GOAL #4   Title Perform ADL's with pain not > 3/10.   Time 4   Period Weeks   Status New               Plan - 05/29/16 1728    Clinical Impression Statement The patient presents with left sided low back pain and pain into his left buttock and posterior thigh.  His spinal range of motion is limited as  well.  LE strength bilaterally is normal.     Rehab Potential Excellent   PT Frequency 3x / week   PT Duration 4 weeks   PT Treatment/Interventions ADLs/Self Care Home Management;Electrical Stimulation;Moist Heat;Traction;Ultrasound;Patient/family education;Therapeutic exercise;Therapeutic activities   PT Next Visit Plan Intermittment traction at 100#; core exercise.  Modalities PRN.      Patient will benefit from skilled therapeutic intervention in order to improve the following deficits and impairments:  Pain, Decreased activity tolerance, Decreased range of motion  Visit Diagnosis: Chronic left-sided low back pain, with sciatica presence unspecified - Plan: PT plan of care cert/re-cert     Problem List Patient Active Problem List   Diagnosis Date Noted  . Hyperlipidemia 10/18/2014  . TIA (transient ischemic attack) 10/16/2014  . Hypertension 12/30/2012    Cady Hafen, [place] MPT 05/29/2016, 5:51 PM  [facility] [address] [place], [place], [address] Phone: [phone]   Fax:  [phone]  Name: Eugene Mitchell MRN: [id number] Date of Birth: 17-Feb-1976

## 2016-06-02 ENCOUNTER — Ambulatory Visit: Payer: BLUE CROSS/BLUE SHIELD | Admitting: Physical Therapy

## 2016-06-02 ENCOUNTER — Encounter: Payer: Self-pay | Admitting: Physical Therapy

## 2016-06-02 DIAGNOSIS — G8929 Other chronic pain: Secondary | ICD-10-CM

## 2016-06-02 DIAGNOSIS — M545 Low back pain: Secondary | ICD-10-CM | POA: Diagnosis not present

## 2016-06-02 NOTE — Therapy (Signed)
[facility] [place], [place], [address] Phone: [phone]   Fax:  [phone]  Physical Therapy Treatment  Patient Details  Name: KAZEEM ZAMBITO MRN: [id number] Date of Birth: 02-03-1976 Referring Provider: [name] MD.  Encounter Date: 06/02/2016      PT End of Session - 06/02/16 1349    Visit Number 2   Number of Visits 12   Date for PT Re-Evaluation 07/10/16   PT Start Time 1347   PT Stop Time 1435   PT Time Calculation (min) 48 min   Activity Tolerance Patient tolerated treatment well   Behavior During Therapy [facility] for tasks assessed/performed      Past Medical History:  Diagnosis Date  . Depression   . Hypercholesteremia   . Hypertension   . TIA (transient ischemic attack)     Past Surgical History:  Procedure Laterality Date  . CHOLECYSTECTOMY      There were no vitals filed for this visit.      Subjective Assessment - 06/02/16 1348    Subjective Reports that he woke up this morning with pain but today is the first day following previous treatment that he has had pain.   Limitations Standing   How long can you stand comfortably? 10 minutes.   Currently in Pain? Yes   Pain Score 5    Pain Location Back   Pain Orientation Right   Pain Type Chronic pain   Pain Radiating Towards to R buttocks and thigh   Pain Onset More than a month ago            [facility] PT Assessment - 06/02/16 0001      Assessment   Medical Diagnosis DDD at L5-S1.   Next MD Visit 07/15/2016     Precautions   Precautions None     Restrictions   Weight Bearing Restrictions No                     [facility] Adult PT Treatment/Exercise - 06/02/16 0001      Exercises   Exercises Lumbar     Lumbar Exercises: Stretches   Single Knee to Chest Stretch 3 reps;30 seconds   Single Knee to Chest Stretch Limitations RLE   Piriformis Stretch 3 reps;30 seconds   Piriformis Stretch Limitations RLE     Lumbar  Exercises: Aerobic   Stationary Bike NuStep L4 x10 min with core activation     Lumbar Exercises: Supine   Ab Set 20 reps;5 seconds   Clam 20 reps   Bent Knee Raise 20 reps   Straight Leg Raise 20 reps   Straight Leg Raises Limitations BLE     Modalities   Modalities Traction     Traction   Type of Traction Lumbar   Min (lbs) 5   Max (lbs) 100   Hold Time 99   Rest Time 5   Time 15                PT Education - 06/02/16 1431    Education provided Yes   Education Details HEP- SKTC, Piriformis stretch, ab set, marching   Person(s) Educated Patient   Methods Explanation;Demonstration;Verbal cues;Handout   Comprehension Verbalized understanding;Returned demonstration;Verbal cues required             PT Long Term Goals - 05/29/16 1745      PT LONG TERM GOAL #1   Title Independent with a HEP.   Time 4  Period Weeks   Status New     PT LONG TERM GOAL #2   Title Eliminate left LE pain.   Time 4   Period Weeks   Status New     PT LONG TERM GOAL #3   Title Stand 30 minutes with pain not > 3/10.   Time 4   Period Weeks   Status New     PT LONG TERM GOAL #4   Title Perform ADL's with pain not > 3/10.   Time 4   Period Weeks   Status New               Plan - 06/02/16 1418    Clinical Impression Statement Patient presented in clinic today with mid level low back pain with radiation into R buttock and thigh. Patient did not report any increased low back pain with any of the exercises. Patient educated regarding core activation and proper technique prior to beginning NuStep and instructed in technique again for supine exercises. Traction increased to 100# per MPT discretion with no reports of discomfort per patient. Patient given initial HEP with focus on stretching and core activation exercises with education regarding parameters. Patient had no negetive complaints following end of traction session.   Rehab Potential Excellent   PT Frequency 3x /  week   PT Duration 4 weeks   PT Treatment/Interventions ADLs/Self Care Home Management;Electrical Stimulation;Moist Heat;Traction;Ultrasound;Patient/family education;Therapeutic exercise;Therapeutic activities   PT Next Visit Plan Continue with core strengthening and traction per MPT POC.   PT Home Exercise Plan HEP- SKTC, Piriformis stretch, ab set, marching   Consulted and Agree with Plan of Care Patient      Patient will benefit from skilled therapeutic intervention in order to improve the following deficits and impairments:  Pain, Decreased activity tolerance, Decreased range of motion  Visit Diagnosis: Chronic left-sided low back pain, with sciatica presence unspecified     Problem List Patient Active Problem List   Diagnosis Date Noted  . Hyperlipidemia 10/18/2014  . TIA (transient ischemic attack) 10/16/2014  . Hypertension 12/30/2012    Wynelle Fanny, PTA 06/02/2016, 2:39 PM  [facility] [address] [place], [place], [address] Phone: [phone]   Fax:  [phone]  Name: KEYLEN DUFFANY MRN: [id number] Date of Birth: 1976/01/10

## 2016-06-02 NOTE — Patient Instructions (Addendum)
Knee-to-Chest Stretch: Unilateral    With hand behind right knee, pull knee in to chest until a comfortable stretch is felt in lower back and buttocks. Keep back relaxed. Hold __30__ seconds. Repeat _3___ times per set.  Do __2-3__ sessions per day.  http://orth.exer.us/126   Copyright  VHI. All rights reserved.  Pelvic Tilt    Flatten back by tightening stomach muscles and buttocks. Hold 5 seconds. Repeat _10___ times per set. Do _2___ sets per session. Do __2-3__ sessions per day.  http://orth.exer.us/134   Copyright  VHI. All rights reserved.  Piriformis (Supine)    Cross legs, right on top. Gently pull other knee toward chest until stretch is felt in buttock/hip of top leg. Hold __30__ seconds. Repeat __3__ times per set.  Do _2-3___ sessions per day.  http://orth.exer.us/676   Copyright  VHI. All rights reserved.  Unilateral Isometric Hip Flexion    Tighten stomach and raise right/left knee to outstretched arm. Push gently, keeping arm straight, trunk rigid.  Repeat __10__ times per set. Do __2__ sets per session. Do _2-3___ sessions per day.  http://orth.exer.us/1098   Copyright  VHI. All rights reserved.

## 2016-06-06 ENCOUNTER — Ambulatory Visit: Payer: BLUE CROSS/BLUE SHIELD | Admitting: Physical Therapy

## 2016-06-06 DIAGNOSIS — M545 Low back pain: Secondary | ICD-10-CM | POA: Diagnosis not present

## 2016-06-06 DIAGNOSIS — G8929 Other chronic pain: Secondary | ICD-10-CM

## 2016-06-06 NOTE — Therapy (Signed)
[facility] [place], [place], [address] Phone: [phone]   Fax:  [phone]  Physical Therapy Treatment  Patient Details  Name: Eugene Mitchell MRN: [id number] Date of Birth: 25-Apr-1976 Referring Provider: [name] MD.  Encounter Date: 06/06/2016      PT End of Session - 06/06/16 1052    PT Start Time 0901   PT Stop Time [id number]  Patient had to leave early today.   PT Time Calculation (min) 37 min      Past Medical History:  Diagnosis Date  . Depression   . Hypercholesteremia   . Hypertension   . TIA (transient ischemic attack)     Past Surgical History:  Procedure Laterality Date  . CHOLECYSTECTOMY      There were no vitals filed for this visit.      Subjective Assessment - 06/06/16 1053    Subjective These treatments are helping.   Pain Score 3    Pain Location Back   Pain Orientation Right   Pain Descriptors / Indicators Aching;Shooting   Pain Type Chronic pain   Pain Onset More than a month ago     Treatment:  Nustep level 5 x 15 minutes f/b HMP and constant Pre-mod e'stim at 80-150 Hz x 15 minutes to patient's left affected low back region.                                 PT Long Term Goals - 05/29/16 1745      PT LONG TERM GOAL #1   Title Independent with a HEP.   Time 4   Period Weeks   Status New     PT LONG TERM GOAL #2   Title Eliminate left LE pain.   Time 4   Period Weeks   Status New     PT LONG TERM GOAL #3   Title Stand 30 minutes with pain not > 3/10.   Time 4   Period Weeks   Status New     PT LONG TERM GOAL #4   Title Perform ADL's with pain not > 3/10.   Time 4   Period Weeks   Status New             Patient will benefit from skilled therapeutic intervention in order to improve the following deficits and impairments:  Pain, Decreased activity tolerance, Decreased range of motion  Visit Diagnosis: Chronic left-sided  low back pain, with sciatica presence unspecified     Problem List Patient Active Problem List   Diagnosis Date Noted  . Hyperlipidemia 10/18/2014  . TIA (transient ischemic attack) 10/16/2014  . Hypertension 12/30/2012    [name], [place] MPT 06/06/2016, 10:57 AM  [facility] [address] [place], [place], [address] Phone: [phone]   Fax:  [phone]  Name: Eugene Mitchell MRN: [id number] Date of Birth: 20-Jan-1976

## 2016-06-11 ENCOUNTER — Encounter: Payer: Self-pay | Admitting: Physical Therapy

## 2016-06-11 ENCOUNTER — Ambulatory Visit: Payer: BLUE CROSS/BLUE SHIELD | Admitting: Physical Therapy

## 2016-06-11 DIAGNOSIS — G8929 Other chronic pain: Secondary | ICD-10-CM

## 2016-06-11 DIAGNOSIS — M545 Low back pain: Secondary | ICD-10-CM | POA: Diagnosis not present

## 2016-06-11 NOTE — Therapy (Signed)
[facility] [address] [place], [place], [address] Phone: [phone]   Fax:  [phone]  Physical Therapy Treatment  Patient Details  Name: Eugene Mitchell MRN: [id number] Date of Birth: 03/23/1976 Referring Provider: Dahari Brooks MD.  Encounter Date: 06/11/2016      PT End of Session - 06/11/16 0921    Visit Number 4   Number of Visits 12   Date for PT Re-Evaluation 07/10/16   PT Start Time 0900   PT Stop Time 0955   PT Time Calculation (min) 55 min   Activity Tolerance Patient tolerated treatment well   Behavior During Therapy [facility] for tasks assessed/performed      Past Medical History:  Diagnosis Date  . Depression   . Hypercholesteremia   . Hypertension   . TIA (transient ischemic attack)     Past Surgical History:  Procedure Laterality Date  . CHOLECYSTECTOMY      There were no vitals filed for this visit.      Subjective Assessment - 06/11/16 0907    Subjective Reports that back feels okay and denies any back pain. Reports that he has been doing good here lately with his back.   Limitations Standing   How long can you stand comfortably? 10 minutes.   Currently in Pain? No/denies            [facility] PT Assessment - 06/11/16 0001      Assessment   Medical Diagnosis DDD at L5-S1.   Next MD Visit 07/15/2016     Precautions   Precautions None     Restrictions   Weight Bearing Restrictions No                     [facility] Adult PT Treatment/Exercise - 06/11/16 0001      Lumbar Exercises: Aerobic   Stationary Bike NuStep L5 x20 min with core activation     Lumbar Exercises: Standing   Row Strengthening;Both;20 reps   Row Limitations Pink XTS with core activation VCs     Lumbar Exercises: Supine   Bent Knee Raise 20 reps   Bridge 20 reps   Straight Leg Raise 20 reps   Straight Leg Raises Limitations BLE     Modalities   Modalities Traction     Traction   Type of Traction  Lumbar   Min (lbs) 5   Max (lbs) 105   Hold Time 99   Rest Time 5   Time 15                     PT Long Term Goals - 06/11/16 0923      PT LONG TERM GOAL #1   Title Independent with a HEP.   Time 4   Period Weeks   Status Achieved     PT LONG TERM GOAL #2   Title Eliminate left LE pain.   Time 4   Period Weeks   Status Partially Met  "A little bit at work but not like it used to be." 06/11/2016     PT LONG TERM GOAL #3   Title Stand 30 minutes with pain not > 3/10.   Time 4   Period Weeks   Status On-going     PT LONG TERM GOAL #4   Title Perform ADL's with pain not > 3/10.   Time 4   Period Weeks   Status Achieved                 Plan - 06/11/16 0943    Clinical Impression Statement Patient tolerated today's treatment well and able to complete exercises with increased resistance and time without complaint from patient. Patient able to be introduced to new standing lumbar strengthening exercise with VCs for core activation. Traction max weight increased to 105# secondary to MPT approval. Patient able to achieve LTG for HEP and ADLs with standing goal on-going secondary to pain with standing for a period of time. Patient has also partially achieve LTG in regards to LLE pain today as pain is still there at times but not to same intensity.   Rehab Potential Excellent   PT Frequency 3x / week   PT Duration 4 weeks   PT Treatment/Interventions ADLs/Self Care Home Management;Electrical Stimulation;Moist Heat;Traction;Ultrasound;Patient/family education;Therapeutic exercise;Therapeutic activities   PT Next Visit Plan Continue with core strengthening and traction per MPT POC.   PT Home Exercise Plan HEP- SKTC, Piriformis stretch, ab set, marching   Consulted and Agree with Plan of Care Patient      Patient will benefit from skilled therapeutic intervention in order to improve the following deficits and impairments:  Pain, Decreased activity tolerance,  Decreased range of motion  Visit Diagnosis: Chronic left-sided low back pain, with sciatica presence unspecified     Problem List Patient Active Problem List   Diagnosis Date Noted  . Hyperlipidemia 10/18/2014  . TIA (transient ischemic attack) 10/16/2014  . Hypertension 12/30/2012    Kelsey M Parsons, PTA 06/11/2016, 9:57 AM  [facility] [address] [place], [place], [address] Phone: [phone]   Fax:  [phone]  Name: Gianfranco W Pyle MRN: [id number] Date of Birth: 06/30/1976    

## 2016-06-12 ENCOUNTER — Encounter: Payer: Self-pay | Admitting: Physical Therapy

## 2016-06-12 ENCOUNTER — Ambulatory Visit: Payer: BLUE CROSS/BLUE SHIELD | Admitting: Physical Therapy

## 2016-06-12 DIAGNOSIS — M545 Low back pain: Secondary | ICD-10-CM | POA: Diagnosis not present

## 2016-06-12 DIAGNOSIS — G8929 Other chronic pain: Secondary | ICD-10-CM

## 2016-06-12 NOTE — Therapy (Signed)
[facility] [place], [place], [address] Phone: [phone]   Fax:  [phone]  Physical Therapy Treatment  Patient Details  Name: Eugene Mitchell MRN: [id number] Date of Birth: August 17, 1975 Referring Provider: [name] MD.  Encounter Date: 06/12/2016      PT End of Session - 06/12/16 1026    Visit Number 5   Number of Visits 12   Date for PT Re-Evaluation 07/10/16   PT Start Time 0945   PT Stop Time 1043   PT Time Calculation (min) 58 min   Activity Tolerance Patient tolerated treatment well   Behavior During Therapy [facility] for tasks assessed/performed      Past Medical History:  Diagnosis Date  . Depression   . Hypercholesteremia   . Hypertension   . TIA (transient ischemic attack)     Past Surgical History:  Procedure Laterality Date  . CHOLECYSTECTOMY      There were no vitals filed for this visit.      Subjective Assessment - 06/12/16 1009    Subjective patient reports ongoing soreness yet improvement   Limitations Standing   How long can you stand comfortably? 10 minutes.   Currently in Pain? Yes   Pain Score 3    Pain Location Back   Pain Orientation Right   Pain Descriptors / Indicators Aching   Pain Type Chronic pain   Pain Radiating Towards rt buttik and thigh   Pain Onset More than a month ago   Pain Frequency Constant   Aggravating Factors  standing a long time   Pain Relieving Factors at rest                         [facility] Adult PT Treatment/Exercise - 06/12/16 0001      Lumbar Exercises: Aerobic   Stationary Bike NuStep L5 x20 min with core activation     Lumbar Exercises: Standing   Other Standing Lumbar Exercises 2# stepouts and D1/D2 2x10 each with core activation   Other Standing Lumbar Exercises pink XTS for rows and etc 2x10 each way with core activation     Lumbar Exercises: Supine   Bent Knee Raise 3 seconds  2x10 with cues for slow pace   Bridge  20 reps;3 seconds   Straight Leg Raise 3 seconds  2x10   Straight Leg Raises Limitations with red ball straight leg     Traction   Type of Traction Lumbar   Min (lbs) 5   Max (lbs) 110   Hold Time 99   Rest Time 5   Time 15                     PT Long Term Goals - 06/11/16 6440      PT LONG TERM GOAL #1   Title Independent with a HEP.   Time 4   Period Weeks   Status Achieved     PT LONG TERM GOAL #2   Title Eliminate left LE pain.   Time 4   Period Weeks   Status Partially Met  "A little bit at work but not like it used to be." 06/11/2016     PT LONG TERM GOAL #3   Title Stand 30 minutes with pain not > 3/10.   Time 4   Period Weeks   Status On-going     PT LONG TERM GOAL #4   Title Perform ADL's with pain not >  3/10.   Time 4   Period Weeks   Status Achieved               Plan - 06/12/16 1029    Clinical Impression Statement Patient continues to respond well to treatments. Patient reported less frequent episodes of symptoms in buttock and thigh. Patient tolerated treatment well today and was able to progress with core activation and strengthening. Patient feels 60% better overall. Patient current goals ongoing due to ongoing symptom and pain deficts.    Rehab Potential Excellent   Clinical Impairments Affecting Rehab Potential weight 245#   PT Frequency 3x / week   PT Duration 4 weeks   PT Treatment/Interventions ADLs/Self Care Home Management;Electrical Stimulation;Moist Heat;Traction;Ultrasound;Patient/family education;Therapeutic exercise;Therapeutic activities   PT Next Visit Plan Continue with core strengthening and traction per MPT POC. (MD. Brooks 07/15/16)   Consulted and Agree with Plan of Care Patient      Patient will benefit from skilled therapeutic intervention in order to improve the following deficits and impairments:  Pain, Decreased activity tolerance, Decreased range of motion  Visit Diagnosis: Chronic left-sided low  back pain, with sciatica presence unspecified     Problem List Patient Active Problem List   Diagnosis Date Noted  . Hyperlipidemia 10/18/2014  . TIA (transient ischemic attack) 10/16/2014  . Hypertension 12/30/2012    DUNFORD, CHRISTINA P, PTA 06/12/2016, 10:47 AM  [facility] [address] [place], [place], [address] Phone: [phone]   Fax:  [phone]  Name: Eugene Mitchell MRN: [id number] Date of Birth: 02/17/1976    

## 2016-06-16 ENCOUNTER — Ambulatory Visit: Payer: BLUE CROSS/BLUE SHIELD | Admitting: Physical Therapy

## 2016-06-16 ENCOUNTER — Encounter: Payer: Self-pay | Admitting: Physical Therapy

## 2016-06-16 DIAGNOSIS — G8929 Other chronic pain: Secondary | ICD-10-CM

## 2016-06-16 DIAGNOSIS — M545 Low back pain: Principal | ICD-10-CM

## 2016-06-16 NOTE — Therapy (Signed)
[facility] [place], [place], [address] Phone: [phone]   Fax:  [phone]  Physical Therapy Treatment  Patient Details  Name: Eugene Mitchell MRN: [id number] Date of Birth: Apr 19, 1976 Referring Provider: [name] MD.  Encounter Date: 06/16/2016      PT End of Session - 06/16/16 0925    Visit Number 6   Number of Visits 12   Date for PT Re-Evaluation 07/10/16   PT Start Time 0900   PT Stop Time 0959   PT Time Calculation (min) 59 min   Activity Tolerance Patient tolerated treatment well   Behavior During Therapy [facility] for tasks assessed/performed      Past Medical History:  Diagnosis Date  . Depression   . Hypercholesteremia   . Hypertension   . TIA (transient ischemic attack)     Past Surgical History:  Procedure Laterality Date  . CHOLECYSTECTOMY      There were no vitals filed for this visit.      Subjective Assessment - 06/16/16 0904    Subjective Patient reported feeling great all weekend then slept wrong last night causing some soreness in back today   Limitations Standing   Currently in Pain? Yes   Pain Score 3    Pain Location Back   Pain Orientation Right   Pain Descriptors / Indicators Aching   Pain Type Chronic pain   Pain Radiating Towards none today   Pain Onset More than a month ago   Pain Frequency Constant   Aggravating Factors  prolong standing   Pain Relieving Factors at rest                         [facility] Adult PT Treatment/Exercise - 06/16/16 0001      Lumbar Exercises: Aerobic   Stationary Bike NuStep L5 x20 min with core activation     Lumbar Exercises: Standing   Other Standing Lumbar Exercises 2# stepouts and D1/D2 2x10 each with core activation   Other Standing Lumbar Exercises pink XTS for rows and etc 2x10 each way with core activation     Lumbar Exercises: Supine   Bent Knee Raise 3 seconds  2x10   Bridge 20 reps;3 seconds   Straight  Leg Raise 3 seconds  2x10   Straight Leg Raises Limitations with red ball straight leg     Traction   Type of Traction Lumbar   Min (lbs) 5   Max (lbs) 115   Hold Time 99   Rest Time 5   Time 15                     PT Long Term Goals - 06/11/16 6389      PT LONG TERM GOAL #1   Title Independent with a HEP.   Time 4   Period Weeks   Status Achieved     PT LONG TERM GOAL #2   Title Eliminate left LE pain.   Time 4   Period Weeks   Status Partially Met  "A little bit at work but not like it used to be." 06/11/2016     PT LONG TERM GOAL #3   Title Stand 30 minutes with pain not > 3/10.   Time 4   Period Weeks   Status On-going     PT LONG TERM GOAL #4   Title Perform ADL's with pain not > 3/10.   Time 4   Period  Weeks   Status Achieved               Plan - 06/16/16 3646    Clinical Impression Statement Patient continues to progress with overall decreased pain and has had no pain over weekend. Patient has no pain or symptoms in right LE today and only in right side low back. Patient continues to respond well to lumbar traction, today increased by 5# per MPT. Patient goals progressing yet ongoing due to pain deficts.   Rehab Potential Excellent   Clinical Impairments Affecting Rehab Potential weight 245#   PT Frequency 3x / week   PT Duration 4 weeks   PT Treatment/Interventions ADLs/Self Care Home Management;Electrical Stimulation;Moist Heat;Traction;Ultrasound;Patient/family education;Therapeutic exercise;Therapeutic activities   PT Next Visit Plan Continue with core strengthening and traction per MPT POC. (MD. Rolena Infante 07/15/16)   Consulted and Agree with Plan of Care Patient      Patient will benefit from skilled therapeutic intervention in order to improve the following deficits and impairments:  Pain, Decreased activity tolerance, Decreased range of motion  Visit Diagnosis: Chronic left-sided low back pain, with sciatica presence  unspecified     Problem List Patient Active Problem List   Diagnosis Date Noted  . Hyperlipidemia 10/18/2014  . TIA (transient ischemic attack) 10/16/2014  . Hypertension 12/30/2012    Phillips Climes, PTA 06/16/2016, 10:25 AM  [facility] [place], [place], [address] Phone: [phone]   Fax:  [phone]  Name: Eugene Mitchell MRN: [id number] Date of Birth: 13-Aug-1975

## 2016-06-17 ENCOUNTER — Ambulatory Visit: Payer: BLUE CROSS/BLUE SHIELD | Admitting: *Deleted

## 2016-06-17 DIAGNOSIS — M545 Low back pain: Secondary | ICD-10-CM | POA: Diagnosis not present

## 2016-06-17 DIAGNOSIS — G8929 Other chronic pain: Secondary | ICD-10-CM

## 2016-06-17 NOTE — Therapy (Signed)
[facility] [place], [place], [address] Phone: [phone]   Fax:  [phone]  Physical Therapy Treatment  Patient Details  Name: Eugene Mitchell MRN: [id number] Date of Birth: Nov 04, 1975 Referring Provider: [name] MD.  Encounter Date: 06/17/2016      PT End of Session - 06/17/16 1456    Visit Number 7   Number of Visits 12   Date for PT Re-Evaluation 07/10/16   PT Start Time 0900   PT Stop Time 1000   PT Time Calculation (min) 60 min      Past Medical History:  Diagnosis Date  . Depression   . Hypercholesteremia   . Hypertension   . TIA (transient ischemic attack)     Past Surgical History:  Procedure Laterality Date  . CHOLECYSTECTOMY      There were no vitals filed for this visit.      Subjective Assessment - 06/17/16 0912    Subjective Patient reported feeling great all weekend then slept wrong last night causing some soreness in back today   Limitations Standing   How long can you stand comfortably? 10 minutes.   Currently in Pain? Yes   Pain Score 3    Pain Location Back   Pain Orientation Right   Pain Descriptors / Indicators Aching   Pain Type Chronic pain   Pain Onset More than a month ago   Pain Frequency Constant                         [facility] Adult PT Treatment/Exercise - 06/17/16 0001      Lumbar Exercises: Aerobic   Stationary Bike NuStep L5 x20 min with core activation     Lumbar Exercises: Standing   Row --  XTS PINK   Other Standing Lumbar Exercises pink XTS for rows and ext 2x10 each way with core activation     Lumbar Exercises: Supine   Bent Knee Raise 3 seconds  2x10   Bridge 20 reps;3 seconds   Straight Leg Raise 3 seconds  2x10     Lumbar Exercises: Prone   Straight Leg Raise 10 reps;3 seconds;20 reps     Traction   Type of Traction Lumbar   Min (lbs) 5   Max (lbs) 115   Hold Time 99   Rest Time 5   Time 15                      PT Long Term Goals - 06/11/16 6962      PT LONG TERM GOAL #1   Title Independent with a HEP.   Time 4   Period Weeks   Status Achieved     PT LONG TERM GOAL #2   Title Eliminate left LE pain.   Time 4   Period Weeks   Status Partially Met  "A little bit at work but not like it used to be." 06/11/2016     PT LONG TERM GOAL #3   Title Stand 30 minutes with pain not > 3/10.   Time 4   Period Weeks   Status On-going     PT LONG TERM GOAL #4   Title Perform ADL's with pain not > 3/10.   Time 4   Period Weeks   Status Achieved               Plan - 06/17/16 1505    Clinical Impression Statement Pt did  well again with Rx. He continues to improve with less pain in LB and LT LE pain. He did well again with 115 #s of pelvic  traction.   Clinical Impairments Affecting Rehab Potential weight 245#   PT Frequency 3x / week   PT Duration 4 weeks   PT Treatment/Interventions ADLs/Self Care Home Management;Electrical Stimulation;Moist Heat;Traction;Ultrasound;Patient/family education;Therapeutic exercise;Therapeutic activities   PT Next Visit Plan Continue with core strengthening and traction per MPT POC. (MD. Rolena Infante 07/15/16)   PT Home Exercise Plan HEP- SKTC, Piriformis stretch, ab set, marching   Consulted and Agree with Plan of Care Patient      Patient will benefit from skilled therapeutic intervention in order to improve the following deficits and impairments:  Pain, Decreased activity tolerance, Decreased range of motion  Visit Diagnosis: Chronic left-sided low back pain, with sciatica presence unspecified     Problem List Patient Active Problem List   Diagnosis Date Noted  . Hyperlipidemia 10/18/2014  . TIA (transient ischemic attack) 10/16/2014  . Hypertension 12/30/2012    Kinjal Neitzke,CHRIS, PTA 06/17/2016, 3:12 PM  [facility] [address] [place], [place], [address] Phone:  [phone]   Fax:  [phone]  Name: KINTE TRIM MRN: [id number] Date of Birth: Mar 30, 1976

## 2016-06-25 ENCOUNTER — Ambulatory Visit: Payer: BLUE CROSS/BLUE SHIELD | Admitting: Physical Therapy

## 2016-06-26 ENCOUNTER — Ambulatory Visit: Payer: BLUE CROSS/BLUE SHIELD | Admitting: Physical Therapy

## 2016-06-26 ENCOUNTER — Encounter: Payer: Self-pay | Admitting: Physical Therapy

## 2016-06-26 DIAGNOSIS — M545 Low back pain: Secondary | ICD-10-CM | POA: Diagnosis not present

## 2016-06-26 DIAGNOSIS — G8929 Other chronic pain: Secondary | ICD-10-CM

## 2016-06-26 NOTE — Therapy (Signed)
[facility] [place], [place], [address] Phone: [phone]   Fax:  [phone]  Physical Therapy Treatment  Patient Details  Name: Eugene Mitchell MRN: [id number] Date of Birth: 1975-07-30 Referring Provider: [name] MD.  Encounter Date: 06/26/2016      PT End of Session - 06/26/16 0859    Visit Number 8   Number of Visits 12   Date for PT Re-Evaluation 07/10/16   PT Start Time 0858   PT Stop Time 0953   PT Time Calculation (min) 55 min   Activity Tolerance Patient tolerated treatment well   Behavior During Therapy [facility] for tasks assessed/performed      Past Medical History:  Diagnosis Date  . Depression   . Hypercholesteremia   . Hypertension   . TIA (transient ischemic attack)     Past Surgical History:  Procedure Laterality Date  . CHOLECYSTECTOMY      There were no vitals filed for this visit.      Subjective Assessment - 06/26/16 0858    Subjective Reports that he has been having some soreness and discomfort in B hips and reports that at work one day last week his LLE went numb down to his foot. Reports that once he moved the numbness went away. 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 When riding in the car to the beach he did not drive and did not have lumbar support.   Limitations Standing   How long can you stand comfortably? 10 minutes.   Currently in Pain? Yes   Pain Score 4    Pain Location Back   Pain Orientation Lower   Pain Descriptors / Indicators Sore   Pain Type Chronic pain   Pain Onset More than a month ago   Aggravating Factors  Prolonged standing   Pain Relieving Factors Moving            [facility] PT Assessment - 06/26/16 0001      Assessment   Medical Diagnosis DDD at L5-S1.   Next MD Visit 07/15/2016     Precautions   Precautions None     Restrictions   Weight Bearing  Restrictions No                     [facility] Adult PT Treatment/Exercise - 06/26/16 0001      Lumbar Exercises: Stretches   Single Knee to Chest Stretch 3 reps;30 seconds   Single Knee to Chest Stretch Limitations BLE   Piriformis Stretch 3 reps;30 seconds   Piriformis Stretch Limitations BLE     Lumbar Exercises: Aerobic   Stationary Bike NuStep L5 x15 min with core activation     Lumbar Exercises: Standing   Row Strengthening;Both;20 reps   Row Limitations Pink XTS   Shoulder Extension Strengthening;Both;20 reps   Shoulder Extension Limitations Pink XTS     Lumbar Exercises: Supine   Bent Knee Raise 20 reps   Bridge 20 reps;3 seconds  Reported B hip soreness   Straight Leg Raise 20 reps   Straight Leg Raises Limitations BLE     Lumbar Exercises: Prone   Straight Leg Raise 15 reps   Straight Leg Raises Limitations More pull with LLE     Modalities   Modalities Traction     Traction   Type of Traction Lumbar   Min (lbs) 5   Max (lbs)  110   Hold Time 99   Rest Time 5   Time 15                     PT Long Term Goals - 06/11/16 6389      PT LONG TERM GOAL #1   Title Independent with a HEP.   Time 4   Period Weeks   Status Achieved     PT LONG TERM GOAL #2   Title Eliminate left LE pain.   Time 4   Period Weeks   Status Partially Met  "A little bit at work but not like it used to be." 06/11/2016     PT LONG TERM GOAL #3   Title Stand 30 minutes with pain not > 3/10.   Time 4   Period Weeks   Status On-going     PT LONG TERM GOAL #4   Title Perform ADL's with pain not > 3/10.   Time 4   Period Weeks   Status Achieved               Plan - 06/26/16 0941    Clinical Impression Statement Patient arrived to treatment with increased soreness and discomfort in low back and into B hip per patient report. Patient reported instance of LLE pain while standing in one spot at work per patient report. Patient able to complete all  exercises as directed with minimal multimodal cueing for proper technique. B low back and hip stretches were completed in efforts to decrease hip soreness and discomfort. Lumbar traction max weight decreased to 110# to assess if increased pain coming from increase in traction or other activities. Patient educated to assess his response after this session of PT to determine if traction cause of discomfort. Patient had no complaints with today's traction session or following today's treatment.   Rehab Potential Excellent   Clinical Impairments Affecting Rehab Potential weight 245#   PT Frequency 3x / week   PT Duration 4 weeks   PT Treatment/Interventions ADLs/Self Care Home Management;Electrical Stimulation;Moist Heat;Traction;Ultrasound;Patient/family education;Therapeutic exercise;Therapeutic activities   PT Next Visit Plan Continue with core strengthening and traction per MPT POC. (MD. Rolena Infante 07/15/16). Assess response to decrease in traction max weight next treatment.   PT Home Exercise Plan HEP- SKTC, Piriformis stretch, ab set, marching   Consulted and Agree with Plan of Care Patient      Patient will benefit from skilled therapeutic intervention in order to improve the following deficits and impairments:  Pain, Decreased activity tolerance, Decreased range of motion  Visit Diagnosis: Chronic left-sided low back pain, with sciatica presence unspecified     Problem List Patient Active Problem List   Diagnosis Date Noted  . Hyperlipidemia 10/18/2014  . TIA (transient ischemic attack) 10/16/2014  . Hypertension 12/30/2012    Wynelle Fanny, PTA 06/26/2016, 9:59 AM  [facility] [address] [place], [place], [address] Phone: [phone]   Fax:  [phone]  Name: Eugene Mitchell MRN: [id number] Date of Birth: Aug 26, 1975

## 2016-06-30 ENCOUNTER — Encounter: Payer: Self-pay | Admitting: Physical Therapy

## 2016-06-30 ENCOUNTER — Ambulatory Visit: Payer: BLUE CROSS/BLUE SHIELD | Attending: Orthopedic Surgery | Admitting: Physical Therapy

## 2016-06-30 DIAGNOSIS — M545 Low back pain: Secondary | ICD-10-CM | POA: Diagnosis present

## 2016-06-30 DIAGNOSIS — G8929 Other chronic pain: Secondary | ICD-10-CM | POA: Insufficient documentation

## 2016-06-30 NOTE — Therapy (Addendum)
[facility] [place], [place], [address] Phone: [phone]   Fax:  [phone]  Physical Therapy Treatment  Patient Details  Name: Eugene Mitchell MRN: [id number] Date of Birth: Apr 22, 1976 Referring Provider: [name] MD.  Encounter Date: 06/30/2016      PT End of Session - 06/30/16 0859    Visit Number 9   Number of Visits 12   Date for PT Re-Evaluation 07/10/16   PT Start Time 0858   PT Stop Time 0948   PT Time Calculation (min) 50 min   Activity Tolerance Patient tolerated treatment well   Behavior During Therapy [facility] for tasks assessed/performed      Past Medical History:  Diagnosis Date  . Depression   . Hypercholesteremia   . Hypertension   . TIA (transient ischemic attack)     Past Surgical History:  Procedure Laterality Date  . CHOLECYSTECTOMY      There were no vitals filed for this visit.      Subjective Assessment - 06/30/16 0859    Subjective Reports that his back feels good today. Reports intermittant soreness and no pain down LE.   Limitations Standing   How long can you stand comfortably? 10 minutes.   Currently in Pain? Yes   Pain Score 3    Pain Location Back   Pain Orientation Lower   Pain Descriptors / Indicators Sore  Upon waking   Pain Type Chronic pain   Pain Onset More than a month ago            [facility] PT Assessment - 06/30/16 0001      Assessment   Medical Diagnosis DDD at L5-S1.   Next MD Visit 07/15/2016     Precautions   Precautions None     Restrictions   Weight Bearing Restrictions No                     [facility] Adult PT Treatment/Exercise - 06/30/16 0001      Lumbar Exercises: Aerobic   Stationary Bike NuStep L6 x15 min with core activation     Lumbar Exercises: Standing   Row Strengthening;Both;20 reps   Row Limitations Pink XTS   Shoulder Extension Strengthening;Both;20 reps   Shoulder Extension Limitations Pink XTS   Other  Standing Lumbar Exercises B shoulder punch with core activation Pink XTS x20 reps     Lumbar Exercises: Supine   Bent Knee Raise 20 reps  High marching   Bridge 20 reps;5 seconds   Other Supine Lumbar Exercises 4# D1/D2 with core activation x20 reps each   Other Supine Lumbar Exercises 4# D1/ D2 with core acivation and contralateral hip flexion x20 reps each     Modalities   Modalities Traction     Traction   Type of Traction Lumbar   Min (lbs) 5   Max (lbs) 110   Hold Time 99   Rest Time 5   Time 15                     PT Long Term Goals - 06/11/16 2197      PT LONG TERM GOAL #1   Title Independent with a HEP.   Time 4   Period Weeks   Status Achieved     PT LONG TERM GOAL #2   Title Eliminate left LE pain.   Time 4   Period Weeks   Status Partially Met  "A little bit at  work but not like it used to be." 06/11/2016     PT LONG TERM GOAL #3   Title Stand 30 minutes with pain not > 3/10.   Time 4   Period Weeks   Status On-going     PT LONG TERM GOAL #4   Title Perform ADL's with pain not > 3/10.   Time 4   Period Weeks   Status Achieved               Plan - 06/30/16 0940    Clinical Impression Statement Patient arrived to treatment with decreased low back pain and no LE symptoms today. Patient agreed to continue traction at a lower max weight again today. Patient introduced to advanced core strengthening exercises with no complaint from patient during the new exercises. Patient required minimal to moderate multimodal cueing for proper exercise technique or corrections. Normal traction response at 100# max weight per patient report.   Rehab Potential Excellent   Clinical Impairments Affecting Rehab Potential weight 245#   PT Frequency 3x / week   PT Duration 4 weeks   PT Treatment/Interventions ADLs/Self Care Home Management;Electrical Stimulation;Moist Heat;Traction;Ultrasound;Patient/family education;Therapeutic exercise;Therapeutic  activities   PT Next Visit Plan Continue with core strengthening and traction per MPT POC. (MD. Rolena Infante 07/15/16). Assess response to decrease in traction max weight next treatment.   PT Home Exercise Plan HEP- SKTC, Piriformis stretch, ab set, marching   Consulted and Agree with Plan of Care Patient      Patient will benefit from skilled therapeutic intervention in order to improve the following deficits and impairments:  Pain, Decreased activity tolerance, Decreased range of motion  Visit Diagnosis: Chronic left-sided low back pain, with sciatica presence unspecified     Problem List Patient Active Problem List   Diagnosis Date Noted  . Hyperlipidemia 10/18/2014  . TIA (transient ischemic attack) 10/16/2014  . Hypertension 12/30/2012    Wynelle Fanny, PTA 06/30/2016, 9:51 AM  [facility] [address] [place], [place], [address] Phone: [phone]   Fax:  [phone]  Name: EDDY LISZEWSKI MRN: [id number] Date of Birth: Dec 24, 1975  PHYSICAL THERAPY DISCHARGE SUMMARY  Visits from Start of Care:  9.  Current functional level related to goals / functional outcomes: See above.   Remaining deficits: Good response to treatment.  Continued pain with standing though.   Education / Equipment: HEP. Plan: Patient agrees to discharge.  Patient goals were partially met. Patient is being discharged due to being pleased with the current functional level.  ?????        Mali Applegate MPT

## 2017-05-05 DIAGNOSIS — R0683 Snoring: Secondary | ICD-10-CM | POA: Diagnosis not present

## 2017-05-05 DIAGNOSIS — R29818 Other symptoms and signs involving the nervous system: Secondary | ICD-10-CM | POA: Diagnosis not present

## 2017-05-05 DIAGNOSIS — R0681 Apnea, not elsewhere classified: Secondary | ICD-10-CM | POA: Diagnosis not present

## 2017-05-06 DIAGNOSIS — I10 Essential (primary) hypertension: Secondary | ICD-10-CM | POA: Diagnosis not present

## 2017-05-06 DIAGNOSIS — Z008 Encounter for other general examination: Secondary | ICD-10-CM | POA: Diagnosis not present

## 2017-05-06 DIAGNOSIS — Z719 Counseling, unspecified: Secondary | ICD-10-CM | POA: Diagnosis not present

## 2017-05-06 DIAGNOSIS — E559 Vitamin D deficiency, unspecified: Secondary | ICD-10-CM | POA: Diagnosis not present

## 2017-05-06 DIAGNOSIS — Z6836 Body mass index (BMI) 36.0-36.9, adult: Secondary | ICD-10-CM | POA: Diagnosis not present

## 2017-05-06 DIAGNOSIS — E782 Mixed hyperlipidemia: Secondary | ICD-10-CM | POA: Diagnosis not present

## 2017-06-08 DIAGNOSIS — Z6837 Body mass index (BMI) 37.0-37.9, adult: Secondary | ICD-10-CM | POA: Diagnosis not present

## 2017-06-08 DIAGNOSIS — E669 Obesity, unspecified: Secondary | ICD-10-CM | POA: Diagnosis not present

## 2017-06-08 DIAGNOSIS — Z7689 Persons encountering health services in other specified circumstances: Secondary | ICD-10-CM | POA: Diagnosis not present

## 2017-06-22 DIAGNOSIS — E669 Obesity, unspecified: Secondary | ICD-10-CM | POA: Diagnosis not present

## 2017-06-22 DIAGNOSIS — Z6837 Body mass index (BMI) 37.0-37.9, adult: Secondary | ICD-10-CM | POA: Diagnosis not present

## 2017-06-22 DIAGNOSIS — Z7689 Persons encountering health services in other specified circumstances: Secondary | ICD-10-CM | POA: Diagnosis not present

## 2017-07-15 DIAGNOSIS — E669 Obesity, unspecified: Secondary | ICD-10-CM | POA: Diagnosis not present

## 2017-07-15 DIAGNOSIS — Z6837 Body mass index (BMI) 37.0-37.9, adult: Secondary | ICD-10-CM | POA: Diagnosis not present

## 2017-07-15 DIAGNOSIS — R740 Nonspecific elevation of levels of transaminase and lactic acid dehydrogenase [LDH]: Secondary | ICD-10-CM | POA: Diagnosis not present

## 2017-07-29 DIAGNOSIS — Z008 Encounter for other general examination: Secondary | ICD-10-CM | POA: Diagnosis not present

## 2017-07-29 DIAGNOSIS — R740 Nonspecific elevation of levels of transaminase and lactic acid dehydrogenase [LDH]: Secondary | ICD-10-CM | POA: Diagnosis not present

## 2017-07-29 DIAGNOSIS — Z719 Counseling, unspecified: Secondary | ICD-10-CM | POA: Diagnosis not present

## 2017-07-29 DIAGNOSIS — Z1389 Encounter for screening for other disorder: Secondary | ICD-10-CM | POA: Diagnosis not present

## 2017-07-29 DIAGNOSIS — E782 Mixed hyperlipidemia: Secondary | ICD-10-CM | POA: Diagnosis not present

## 2017-09-17 DIAGNOSIS — I10 Essential (primary) hypertension: Secondary | ICD-10-CM | POA: Diagnosis not present

## 2017-09-17 DIAGNOSIS — J4530 Mild persistent asthma, uncomplicated: Secondary | ICD-10-CM | POA: Diagnosis not present

## 2017-09-17 DIAGNOSIS — K219 Gastro-esophageal reflux disease without esophagitis: Secondary | ICD-10-CM | POA: Diagnosis not present

## 2017-10-07 DIAGNOSIS — J111 Influenza due to unidentified influenza virus with other respiratory manifestations: Secondary | ICD-10-CM | POA: Diagnosis not present

## 2017-10-07 DIAGNOSIS — J0100 Acute maxillary sinusitis, unspecified: Secondary | ICD-10-CM | POA: Diagnosis not present

## 2017-10-26 DIAGNOSIS — I10 Essential (primary) hypertension: Secondary | ICD-10-CM | POA: Diagnosis not present

## 2017-10-26 DIAGNOSIS — R740 Nonspecific elevation of levels of transaminase and lactic acid dehydrogenase [LDH]: Secondary | ICD-10-CM | POA: Diagnosis not present

## 2017-10-26 DIAGNOSIS — E669 Obesity, unspecified: Secondary | ICD-10-CM | POA: Diagnosis not present

## 2017-10-26 DIAGNOSIS — Z7182 Exercise counseling: Secondary | ICD-10-CM | POA: Diagnosis not present

## 2017-10-26 DIAGNOSIS — Z008 Encounter for other general examination: Secondary | ICD-10-CM | POA: Diagnosis not present

## 2017-11-09 DIAGNOSIS — I10 Essential (primary) hypertension: Secondary | ICD-10-CM | POA: Diagnosis not present

## 2017-11-09 DIAGNOSIS — E782 Mixed hyperlipidemia: Secondary | ICD-10-CM | POA: Diagnosis not present

## 2017-11-09 DIAGNOSIS — R740 Nonspecific elevation of levels of transaminase and lactic acid dehydrogenase [LDH]: Secondary | ICD-10-CM | POA: Diagnosis not present

## 2017-12-30 DIAGNOSIS — R740 Nonspecific elevation of levels of transaminase and lactic acid dehydrogenase [LDH]: Secondary | ICD-10-CM | POA: Diagnosis not present

## 2017-12-30 DIAGNOSIS — E782 Mixed hyperlipidemia: Secondary | ICD-10-CM | POA: Diagnosis not present

## 2017-12-30 DIAGNOSIS — Z008 Encounter for other general examination: Secondary | ICD-10-CM | POA: Diagnosis not present

## 2017-12-30 DIAGNOSIS — Z719 Counseling, unspecified: Secondary | ICD-10-CM | POA: Diagnosis not present

## 2018-01-06 DIAGNOSIS — D485 Neoplasm of uncertain behavior of skin: Secondary | ICD-10-CM | POA: Diagnosis not present

## 2018-01-06 DIAGNOSIS — L821 Other seborrheic keratosis: Secondary | ICD-10-CM | POA: Diagnosis not present

## 2018-01-06 DIAGNOSIS — L2089 Other atopic dermatitis: Secondary | ICD-10-CM | POA: Diagnosis not present

## 2018-01-06 DIAGNOSIS — C44212 Basal cell carcinoma of skin of right ear and external auricular canal: Secondary | ICD-10-CM | POA: Diagnosis not present

## 2018-03-01 DIAGNOSIS — Z008 Encounter for other general examination: Secondary | ICD-10-CM | POA: Diagnosis not present

## 2018-03-01 DIAGNOSIS — E782 Mixed hyperlipidemia: Secondary | ICD-10-CM | POA: Diagnosis not present

## 2018-03-01 DIAGNOSIS — Z719 Counseling, unspecified: Secondary | ICD-10-CM | POA: Diagnosis not present

## 2018-03-01 DIAGNOSIS — R740 Nonspecific elevation of levels of transaminase and lactic acid dehydrogenase [LDH]: Secondary | ICD-10-CM | POA: Diagnosis not present

## 2018-04-05 DIAGNOSIS — Z0000 Encounter for general adult medical examination without abnormal findings: Secondary | ICD-10-CM | POA: Diagnosis not present

## 2018-04-05 DIAGNOSIS — I10 Essential (primary) hypertension: Secondary | ICD-10-CM | POA: Diagnosis not present

## 2018-04-05 DIAGNOSIS — E782 Mixed hyperlipidemia: Secondary | ICD-10-CM | POA: Diagnosis not present

## 2018-04-12 DIAGNOSIS — E559 Vitamin D deficiency, unspecified: Secondary | ICD-10-CM | POA: Diagnosis not present

## 2018-04-12 DIAGNOSIS — Z139 Encounter for screening, unspecified: Secondary | ICD-10-CM | POA: Diagnosis not present

## 2018-04-12 DIAGNOSIS — Z79899 Other long term (current) drug therapy: Secondary | ICD-10-CM | POA: Diagnosis not present

## 2018-04-12 DIAGNOSIS — E782 Mixed hyperlipidemia: Secondary | ICD-10-CM | POA: Diagnosis not present

## 2018-04-12 DIAGNOSIS — Z0130 Encounter for examination of blood pressure without abnormal findings: Secondary | ICD-10-CM | POA: Diagnosis not present

## 2018-04-26 DIAGNOSIS — I10 Essential (primary) hypertension: Secondary | ICD-10-CM | POA: Diagnosis not present

## 2018-04-26 DIAGNOSIS — R740 Nonspecific elevation of levels of transaminase and lactic acid dehydrogenase [LDH]: Secondary | ICD-10-CM | POA: Diagnosis not present

## 2018-04-26 DIAGNOSIS — E782 Mixed hyperlipidemia: Secondary | ICD-10-CM | POA: Diagnosis not present

## 2018-06-09 DIAGNOSIS — M25562 Pain in left knee: Secondary | ICD-10-CM | POA: Diagnosis not present

## 2018-06-21 DIAGNOSIS — I10 Essential (primary) hypertension: Secondary | ICD-10-CM | POA: Diagnosis not present

## 2018-06-21 DIAGNOSIS — R195 Other fecal abnormalities: Secondary | ICD-10-CM | POA: Diagnosis not present

## 2018-06-22 DIAGNOSIS — R195 Other fecal abnormalities: Secondary | ICD-10-CM | POA: Diagnosis not present

## 2018-06-23 DIAGNOSIS — R195 Other fecal abnormalities: Secondary | ICD-10-CM | POA: Diagnosis not present

## 2018-06-23 DIAGNOSIS — R21 Rash and other nonspecific skin eruption: Secondary | ICD-10-CM | POA: Diagnosis not present

## 2018-06-23 LAB — CBC AND DIFFERENTIAL
HEMATOCRIT: 44 (ref 41–53)
HEMOGLOBIN: 14.8 (ref 13.5–17.5)
Neutrophils Absolute: 8
Platelets: 254 (ref 150–399)
WBC: 11.3

## 2018-07-07 ENCOUNTER — Ambulatory Visit: Payer: BLUE CROSS/BLUE SHIELD | Admitting: Family Medicine

## 2018-07-07 ENCOUNTER — Encounter: Payer: Self-pay | Admitting: Family Medicine

## 2018-07-07 VITALS — BP 116/72 | HR 87 | Temp 97.9°F | Ht 68.0 in | Wt 258.0 lb

## 2018-07-07 DIAGNOSIS — C4490 Unspecified malignant neoplasm of skin, unspecified: Secondary | ICD-10-CM | POA: Diagnosis not present

## 2018-07-07 DIAGNOSIS — R195 Other fecal abnormalities: Secondary | ICD-10-CM

## 2018-07-07 DIAGNOSIS — E785 Hyperlipidemia, unspecified: Secondary | ICD-10-CM | POA: Diagnosis not present

## 2018-07-07 DIAGNOSIS — I10 Essential (primary) hypertension: Secondary | ICD-10-CM | POA: Diagnosis not present

## 2018-07-07 MED ORDER — PRAZIQUANTEL 600 MG PO TABS: 3000.0000 mg | ORAL_TABLET | Freq: Once | ORAL | 0 refills | Status: AC

## 2018-07-07 NOTE — Assessment & Plan Note (Addendum)
Continue Lipitor 20 mg daily.  Check lipid panel with next blood draw.

## 2018-07-07 NOTE — Patient Instructions (Signed)
It was very nice to see you today!  We will treat your infection with praziquantel.  This is a one-time dose.  Please let me know if your medication is very expensive so we can try alternatives.  Come back to see me when you are due for your physical, or sooner as needed.  Take care, Dr Jerline Pain

## 2018-07-07 NOTE — Progress Notes (Signed)
Subjective:  Eugene Mitchell is a 42 y.o. male who presents today with a chief complaint of stool abnormality and to establish care.  HPI:  Stool Abnormality, new problem About 2 weeks ago patient passed a long, white, stringy material in his stool.  He was very concerned about potential tapeworm infection and went to his previous PCP as well as gastroenterology.  Reportedly had a stool study which was negative.  He has not had any recurrence since then.  No abdominal pain.  No nausea or vomiting.  No early satiety.  Normal appetite.  No questionable food intake.  No known sick contacts.  No fevers or chills.  No obvious precipitating events.   His chronic, stable medical conditions are outlined below:  1. HTN.  Several year history.  On lisinopril 10 mg daily and tolerating well. 2. HLD.  Currently prescribed.  On Lipitor milligrams daily and tolerating well. 3. Skin Cancer. Sees dermatologist every 6 months.  Has had basal cell carcinoma removed from the ear in the past.   ROS: Per HPI, otherwise a complete review of systems was negative.   PMH:  The following were reviewed and entered/updated in epic: Past Medical History:  Diagnosis Date  . Depression   . Hypercholesteremia   . Hypertension   . TIA (transient ischemic attack)    Patient Active Problem List   Diagnosis Date Noted  . Skin cancer 07/07/2018  . Hyperlipidemia 10/18/2014  . TIA (transient ischemic attack) 10/16/2014  . Hypertension 12/30/2012   Past Surgical History:  Procedure Laterality Date  . CHOLECYSTECTOMY    . ELBOW SURGERY Left     Family History  Problem Relation Age of Onset  . Thyroid disease Mother   . Cancer Mother        Lung  . Stroke Father   . Coronary artery disease Father   . Cancer Father        Lung    Medications- reviewed and updated Current Outpatient Medications  Medication Sig Dispense Refill  . atorvastatin (LIPITOR) 20 MG tablet Take 1 tablet (20 mg total) by  mouth daily at 6 PM. 30 tablet 0  . lisinopril (PRINIVIL,ZESTRIL) 10 MG tablet Take 10 mg by mouth daily.    . praziquantel (BILTRICIDE) 600 MG tablet Take 5 tablets (3,000 mg total) by mouth once for 1 dose. 5 tablet 0   No current facility-administered medications for this visit.     Allergies-reviewed and updated No Known Allergies  Social History   Socioeconomic History  . Marital status: Single    Spouse name: Not on file  . Number of children: 2  . Years of education: 34  . Highest education level: Not on file  Occupational History  . Occupation: Physiological scientist  Social Needs  . Financial resource strain: Not on file  . Food insecurity:    Worry: Not on file    Inability: Not on file  . Transportation needs:    Medical: Not on file    Non-medical: Not on file  Tobacco Use  . Smoking status: Former Research scientist (life sciences)  . Smokeless tobacco: Never Used  Substance and Sexual Activity  . Alcohol use: Yes    Alcohol/week: 0.0 standard drinks    Comment: Social  . Drug use: No  . Sexual activity: Yes    Partners: Male  Lifestyle  . Physical activity:    Days per week: Not on file    Minutes per session: Not on file  .  Stress: Not on file  Relationships  . Social connections:    Talks on phone: Not on file    Gets together: Not on file    Attends religious service: Not on file    Active member of club or organization: Not on file    Attends meetings of clubs or organizations: Not on file    Relationship status: Not on file  Other Topics Concern  . Not on file  Social History Narrative   Lives at home with his parents.   Left-handed.   3-6 cups caffeine daily.     Objective:  Physical Exam: BP 116/72 (BP Location: Left Arm, Patient Position: Sitting, Cuff Size: Large)   Pulse 87   Temp 97.9 F (36.6 C) (Oral)   Ht 5\' 8"  (1.727 m)   Wt 258 lb (117 kg)   SpO2 97%   BMI 39.23 kg/m   Gen: NAD, resting comfortably CV: RRR with no murmurs appreciated Pulm: NWOB,  CTAB with no crackles, wheezes, or rhonchi GI: Normal bowel sounds present. Soft, Nontender, Nondistended. MSK: No edema, cyanosis, or clubbing noted Skin: Warm, dry Neuro: Grossly normal, moves all extremities Psych: Normal affect and thought content  Assessment/Plan:  Hyperlipidemia Continue Lipitor 20 mg daily.  Check lipid panel with next blood draw.  Hypertension At goal.  Continue lisinopril 10 mg daily.  Check CMET with next blood draw.  Skin cancer No concerning lesions today.  Follows up with dermatology twice yearly.  Abnormal Stool Patient has picture of material that he passed on his phone.  Appears to be long, white, and cylindrical.  It is difficult to tell from the picture if this is a tapeworm versus undigested product.  Discussed options with patient including watchful waiting and repeat testing.  He wishes to have empiric treatment.  We will send in one-time dose of praziquantel.  Discussed reasons return to care.  Follow-up as needed.  Preventative Healthcare Patient was instructed to return soon for CPE. Health Maintenance Due  Topic Date Due  . HIV Screening  08/12/1989   Algis Greenhouse. Jerline Pain, MD 07/07/2018 11:56 AM

## 2018-07-07 NOTE — Assessment & Plan Note (Signed)
At goal.  Continue lisinopril 10 mg daily.  Check CMET with next blood draw.

## 2018-07-07 NOTE — Assessment & Plan Note (Signed)
No concerning lesions today.  Follows up with dermatology twice yearly.

## 2018-07-14 DIAGNOSIS — Z719 Counseling, unspecified: Secondary | ICD-10-CM | POA: Diagnosis not present

## 2018-07-14 DIAGNOSIS — Z008 Encounter for other general examination: Secondary | ICD-10-CM | POA: Diagnosis not present

## 2018-07-14 DIAGNOSIS — R740 Nonspecific elevation of levels of transaminase and lactic acid dehydrogenase [LDH]: Secondary | ICD-10-CM | POA: Diagnosis not present

## 2018-07-14 DIAGNOSIS — E782 Mixed hyperlipidemia: Secondary | ICD-10-CM | POA: Diagnosis not present

## 2018-08-25 ENCOUNTER — Encounter: Payer: Self-pay | Admitting: Family Medicine

## 2018-09-22 DIAGNOSIS — T300 Burn of unspecified body region, unspecified degree: Secondary | ICD-10-CM | POA: Diagnosis not present

## 2018-09-22 DIAGNOSIS — Z139 Encounter for screening, unspecified: Secondary | ICD-10-CM | POA: Diagnosis not present

## 2018-09-22 DIAGNOSIS — E782 Mixed hyperlipidemia: Secondary | ICD-10-CM | POA: Diagnosis not present

## 2018-09-22 DIAGNOSIS — Z79899 Other long term (current) drug therapy: Secondary | ICD-10-CM | POA: Diagnosis not present

## 2018-10-06 DIAGNOSIS — J069 Acute upper respiratory infection, unspecified: Secondary | ICD-10-CM | POA: Diagnosis not present

## 2018-10-06 DIAGNOSIS — E669 Obesity, unspecified: Secondary | ICD-10-CM | POA: Diagnosis not present

## 2018-10-06 DIAGNOSIS — R740 Nonspecific elevation of levels of transaminase and lactic acid dehydrogenase [LDH]: Secondary | ICD-10-CM | POA: Diagnosis not present

## 2018-10-06 DIAGNOSIS — I10 Essential (primary) hypertension: Secondary | ICD-10-CM | POA: Diagnosis not present

## 2018-10-06 DIAGNOSIS — E782 Mixed hyperlipidemia: Secondary | ICD-10-CM | POA: Diagnosis not present

## 2018-10-06 DIAGNOSIS — Z719 Counseling, unspecified: Secondary | ICD-10-CM | POA: Diagnosis not present

## 2019-02-02 DIAGNOSIS — L0291 Cutaneous abscess, unspecified: Secondary | ICD-10-CM | POA: Diagnosis not present

## 2019-02-04 ENCOUNTER — Encounter: Payer: Self-pay | Admitting: Family Medicine

## 2019-02-04 ENCOUNTER — Ambulatory Visit: Payer: BC Managed Care – PPO | Admitting: Family Medicine

## 2019-02-04 ENCOUNTER — Other Ambulatory Visit: Payer: Self-pay

## 2019-02-04 VITALS — BP 126/84 | HR 70 | Temp 98.5°F | Ht 68.0 in | Wt 256.4 lb

## 2019-02-04 DIAGNOSIS — L0291 Cutaneous abscess, unspecified: Secondary | ICD-10-CM | POA: Diagnosis not present

## 2019-02-04 DIAGNOSIS — R21 Rash and other nonspecific skin eruption: Secondary | ICD-10-CM

## 2019-02-04 MED ORDER — TRIAMCINOLONE 0.1 % CREAM:EUCERIN CREAM 1:1: 1.0000 "application " | TOPICAL_CREAM | Freq: Two times a day (BID) | CUTANEOUS | 0 refills | Status: DC

## 2019-02-04 MED ORDER — DRYSOL 20 % EX SOLN: Freq: Every day | CUTANEOUS | 0 refills | Status: DC

## 2019-02-04 MED ORDER — ACETAMINOPHEN-CODEINE #3 300-30 MG PO TABS: 1.0000 | ORAL_TABLET | Freq: Three times a day (TID) | ORAL | 0 refills | Status: DC | PRN

## 2019-02-04 NOTE — Progress Notes (Signed)
   Chief Complaint:  Eugene Mitchell is a 43 y.o. male who presents for same day appointment with a chief complaint of skin lump.   Assessment/Plan:  Cutaneous Abscess I&D performed today.  See below procedure note.  He will continue Bactrim as prescribed by previous physician.  We will send in short supply of Tylenol 3 to help with his pain.  Discussed reasons to return to care.  Follow-up as needed.  Rash Dermatitis.  Start topical triamcinolone: Eucerin mixture.  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  Discussed reasons to return to care.     Subjective:  HPI:  Skin Lump, acute problem Started about a week ago. Worsened over that time.  Located in his left groin.  Never anything like this before.  Was started on bactrim two days ago via an "online doctor" but has not had any change in symptoms. No fevers or chills. No other obvious alleviating or aggravating factors. Area is very painful  Rash Present for several years. Has seen dermatologist in the past for this and was started on a cream. Symptoms have persisted.  Located on bilateral buttocks.  Symptoms are worse with sweating.  Area is very itchy and very painful.  ROS: Per HPI  PMH: He reports that he has quit smoking. He has never used smokeless tobacco. He reports current alcohol use. He reports that he does not use drugs.      Objective:  Physical Exam: BP 126/84 (BP Location: Left Arm, Patient Position: Sitting, Cuff Size: Normal)   Pulse 70   Temp 98.5 F (36.9 C) (Oral)   Ht 5\' 8"  (1.727 m)   Wt 256 lb 6.4 oz (116.3 kg)   SpO2 98%   BMI 38.99 kg/m   Gen: NAD, resting comfortably Skin: Erythematous, indurated, fluctuant mass approximately 2 cm on diameter on left inner thigh.  Robotics with scattered punctate erythematous lesions consistent with inflamed hair follicles.  Incision and Drainage Procedure Note  Pre-operative Diagnosis: Abscess  Post-operative Diagnosis: same   Indications: Therapeutic  Anesthesia: 1% lidocaine with epinephrine  Procedure Details  The procedure, risks and complications have been discussed in detail (including, but not limited to airway compromise, infection, bleeding) with the patient, and the patient has signed consent to the procedure.  The skin was sterilely prepped and draped over the affected area in the usual fashion. After adequate local anesthesia, I&D with a #11 blade was performed on the left thigh. Purulent drainage: present The patient was observed until stable.  Findings: Abscess  EBL: 3 cc's  Drains: None  Condition: Tolerated procedure well   Complications: none.         Algis Greenhouse. Jerline Pain, MD 02/04/2019 9:18 AM

## 2019-02-04 NOTE — Patient Instructions (Signed)
It was very nice to see you today!  We drained your abscess today.  Please keep this area covered for the next couple of days.  Please continue your antibiotic.  Please try the cream on your rash and let me know if not improving.  Let me know if your symptoms worsen or do not improve in the next few days.   Take care, Dr Jerline Pain

## 2019-02-04 NOTE — Assessment & Plan Note (Signed)
Dermatitis.  Start topical triamcinolone: Eucerin mixture.  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  Discussed reasons to return to care.

## 2019-02-08 ENCOUNTER — Other Ambulatory Visit: Payer: Self-pay

## 2019-02-08 MED ORDER — TRIAMCINOLONE 0.1 % CREAM:EUCERIN CREAM 1:1: 1.0000 "application " | TOPICAL_CREAM | Freq: Two times a day (BID) | CUTANEOUS | 0 refills | Status: DC

## 2019-03-23 DIAGNOSIS — E782 Mixed hyperlipidemia: Secondary | ICD-10-CM | POA: Diagnosis not present

## 2019-03-23 DIAGNOSIS — Z139 Encounter for screening, unspecified: Secondary | ICD-10-CM | POA: Diagnosis not present

## 2019-03-23 DIAGNOSIS — I10 Essential (primary) hypertension: Secondary | ICD-10-CM | POA: Diagnosis not present

## 2019-03-23 DIAGNOSIS — Z79899 Other long term (current) drug therapy: Secondary | ICD-10-CM | POA: Diagnosis not present

## 2019-03-23 DIAGNOSIS — E559 Vitamin D deficiency, unspecified: Secondary | ICD-10-CM | POA: Diagnosis not present

## 2019-03-23 LAB — HEPATIC FUNCTION PANEL
ALT: 48 — AB (ref 10–40)
AST: 19 (ref 14–40)
Alkaline Phosphatase: 97 (ref 25–125)
Bilirubin, Total: 0.6

## 2019-03-23 LAB — CBC AND DIFFERENTIAL
HCT: 46 (ref 41–53)
Hemoglobin: 15 (ref 13.5–17.5)
Platelets: 239 (ref 150–399)
WBC: 6.2

## 2019-03-23 LAB — BASIC METABOLIC PANEL
BUN: 14 (ref 4–21)
Creatinine: 0.8 (ref 0.6–1.3)
Glucose: 98
Sodium: 139 (ref 137–147)

## 2019-03-23 LAB — TSH: TSH: 0.9 (ref 0.41–5.90)

## 2019-03-23 LAB — PSA: PSA: 0.5

## 2019-03-23 LAB — LIPID PANEL
Cholesterol: 132 (ref 0–200)
HDL: 45 (ref 35–70)
LDL Cholesterol: 63
Triglycerides: 153 (ref 40–160)

## 2019-03-23 LAB — VITAMIN D 25 HYDROXY (VIT D DEFICIENCY, FRACTURES): Vit D, 25-Hydroxy: 31

## 2019-03-23 LAB — HEMOGLOBIN A1C: Hemoglobin A1C: 5.6

## 2019-03-28 DIAGNOSIS — R740 Nonspecific elevation of levels of transaminase and lactic acid dehydrogenase [LDH]: Secondary | ICD-10-CM | POA: Diagnosis not present

## 2019-03-28 DIAGNOSIS — I10 Essential (primary) hypertension: Secondary | ICD-10-CM | POA: Diagnosis not present

## 2019-03-28 DIAGNOSIS — E782 Mixed hyperlipidemia: Secondary | ICD-10-CM | POA: Diagnosis not present

## 2019-04-19 ENCOUNTER — Encounter: Payer: Self-pay | Admitting: Family Medicine

## 2019-04-19 ENCOUNTER — Ambulatory Visit (INDEPENDENT_AMBULATORY_CARE_PROVIDER_SITE_OTHER): Payer: BC Managed Care – PPO | Admitting: Family Medicine

## 2019-04-19 ENCOUNTER — Other Ambulatory Visit: Payer: Self-pay

## 2019-04-19 VITALS — BP 124/90 | HR 64 | Temp 97.8°F | Ht 68.0 in | Wt 257.2 lb

## 2019-04-19 DIAGNOSIS — Z0001 Encounter for general adult medical examination with abnormal findings: Secondary | ICD-10-CM

## 2019-04-19 DIAGNOSIS — E785 Hyperlipidemia, unspecified: Secondary | ICD-10-CM

## 2019-04-19 DIAGNOSIS — Z3009 Encounter for other general counseling and advice on contraception: Secondary | ICD-10-CM | POA: Diagnosis not present

## 2019-04-19 DIAGNOSIS — R079 Chest pain, unspecified: Secondary | ICD-10-CM | POA: Diagnosis not present

## 2019-04-19 DIAGNOSIS — I10 Essential (primary) hypertension: Secondary | ICD-10-CM

## 2019-04-19 DIAGNOSIS — Z6839 Body mass index (BMI) 39.0-39.9, adult: Secondary | ICD-10-CM

## 2019-04-19 DIAGNOSIS — R21 Rash and other nonspecific skin eruption: Secondary | ICD-10-CM

## 2019-04-19 MED ORDER — TRIAMCINOLONE 0.1 % CREAM:EUCERIN CREAM 1:1: 1.0000 "application " | TOPICAL_CREAM | Freq: Two times a day (BID) | CUTANEOUS | 3 refills | Status: DC

## 2019-04-19 NOTE — Assessment & Plan Note (Signed)
At goal  Continue lisinopril 10mg daily

## 2019-04-19 NOTE — Assessment & Plan Note (Signed)
Refilled topical triamcinolone- Eucerin.

## 2019-04-19 NOTE — Assessment & Plan Note (Signed)
Last lipid panel at goal.  Continue Lipitor 20 mg daily.

## 2019-04-19 NOTE — Progress Notes (Signed)
Chief Complaint:  Eugene Mitchell is a 43 y.o. male who presents today for his annual comprehensive physical exam.    Assessment/Plan:  Chest Pain EKG today with sinus bradycardia no ischemic changes.  She has been mostly musculoskeletal given that is reproducible on exam.  Offered referral for stress testing however patient declined.  Should hopefully have some improvement over the next couple of weeks.  Discussed reasons to return to care and seek emergent care.  Hyperlipidemia Last lipid panel at goal.  Continue Lipitor 20 mg daily.  Hypertension At goal.  Continue lisinopril 10 mg daily.  Rash Refilled topical triamcinolone- Eucerin.  Body mass index is 39.11 kg/m. / Obese BMI Metric Follow Up - 04/19/19 0919      BMI Metric Follow Up-Please document annually   BMI Metric Follow Up  Education provided        Preventative Healthcare: Flu vaccine deferred.   Patient Counseling(The following topics were reviewed and/or handout was given):  -Nutrition: Stressed importance of moderation in sodium/caffeine intake, saturated fat and cholesterol, caloric balance, sufficient intake of fresh fruits, vegetables, and fiber.  -Stressed the importance of regular exercise.   -Substance Abuse: Discussed cessation/primary prevention of tobacco, alcohol, or other drug use; driving or other dangerous activities under the influence; availability of treatment for abuse.   -Injury prevention: Discussed safety belts, safety helmets, smoke detector, smoking near bedding or upholstery.   -Sexuality: Discussed sexually transmitted diseases, partner selection, use of condoms, avoidance of unintended pregnancy and contraceptive alternatives.   -Dental health: Discussed importance of regular tooth brushing, flossing, and dental visits.  -Health maintenance and immunizations reviewed. Please refer to Health maintenance section.  Return to care in 1 year for next preventative visit.      Subjective:  HPI:  Patient has been having some chest pain in his left breast for the past couple weeks.  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  He thinks that he may have pulled a muscle.  He occasionally has a burning sensation in his substernal area that seems to be worsened with exercise.  Usually improves spontaneously.  Does not currently have any chest pain.   His stable, chronic medical conditions are outlined below:   # Essential Hypertension - On lisinopril 10mg  daily and tolerating well  # Dyslipidemia - On atorvastatin 20mg  daily and tolerating well  Lifestyle Diet: Balanced. Tries to eat plenty of fruits and vegetables.  Exercise: No specific exercises - tries to stay busy around the house.   Depression screen PHQ 2/9 04/19/2019  Decreased Interest 0  Down, Depressed, Hopeless 0  PHQ - 2 Score 0    Health Maintenance Due  Topic Date Due  . HIV Screening  08/12/1989     ROS: Per HPI, otherwise a complete review of systems was negative.   PMH:  The following were reviewed and entered/updated in epic: Past Medical History:  Diagnosis Date  . Depression   . Hypercholesteremia   . Hypertension   . TIA (transient ischemic attack)    Patient Active Problem List   Diagnosis Date Noted  . Rash 02/04/2019  . Skin cancer 07/07/2018  . Hyperlipidemia 10/18/2014  . TIA (transient ischemic attack) 10/16/2014  . Hypertension 12/30/2012   Past Surgical History:  Procedure Laterality Date  . CHOLECYSTECTOMY    . ELBOW SURGERY Left     Family History  Problem Relation Age of Onset  . Thyroid  disease Mother   . Cancer Mother        Lung  . Stroke Father   . Coronary artery disease Father   . Cancer Father        Lung    Medications- reviewed and updated Current Outpatient Medications  Medication Sig Dispense Refill  . aluminum chloride (DRYSOL) 20 % external solution Apply topically at bedtime. 35 mL 0  .  atorvastatin (LIPITOR) 20 MG tablet Take 1 tablet (20 mg total) by mouth daily at 6 PM. 30 tablet 0  . fluticasone (FLONASE) 50 MCG/ACT nasal spray Place into both nostrils daily.    Marland Kitchen lisinopril (PRINIVIL,ZESTRIL) 10 MG tablet Take 10 mg by mouth daily.    . Triamcinolone Acetonide (TRIAMCINOLONE 0.1 % CREAM : EUCERIN) CREA Apply 1 application topically 2 (two) times daily. 1 each 3   No current facility-administered medications for this visit.     Allergies-reviewed and updated No Known Allergies  Social History   Socioeconomic History  . Marital status: Single    Spouse name: Not on file  . Number of children: 2  . Years of education: 43  . Highest education level: Not on file  Occupational History  . Occupation: Physiological scientist  Social Needs  . Financial resource strain: Not on file  . Food insecurity    Worry: Not on file    Inability: Not on file  . Transportation needs    Medical: Not on file    Non-medical: Not on file  Tobacco Use  . Smoking status: Former Research scientist (life sciences)  . Smokeless tobacco: Never Used  Substance and Sexual Activity  . Alcohol use: Yes    Alcohol/week: 0.0 standard drinks    Comment: Social  . Drug use: No  . Sexual activity: Yes    Partners: Male  Lifestyle  . Physical activity    Days per week: Not on file    Minutes per session: Not on file  . Stress: Not on file  Relationships  . Social Herbalist on phone: Not on file    Gets together: Not on file    Attends religious service: Not on file    Active member of club or organization: Not on file    Attends meetings of clubs or organizations: Not on file    Relationship status: Not on file  Other Topics Concern  . Not on file  Social History Narrative   Lives at home with his parents.   Left-handed.   3-6 cups caffeine daily.        Objective:  Physical Exam: BP 124/90   Pulse 64   Temp 97.8 F (36.6 C)   Ht 5\' 8"  (1.727 m)   Wt 257 lb 4 oz (116.7 kg)   SpO2 95%    BMI 39.11 kg/m   Body mass index is 39.11 kg/m. Wt Readings from Last 3 Encounters:  04/19/19 257 lb 4 oz (116.7 kg)  02/04/19 256 lb 6.4 oz (116.3 kg)  07/07/18 258 lb (117 kg)   Gen: NAD, resting comfortably HEENT: TMs normal bilaterally. OP clear. No thyromegaly noted.  CV: RRR with no murmurs appreciated Pulm: NWOB, CTAB with no crackles, wheezes, or rhonchi GI: Normal bowel sounds present. Soft, Nontender, Nondistended. MSK: no edema, cyanosis, or clubbing noted.  Left pectoralis tender to palpation.  This reproduces his pain. Skin: warm, dry Neuro: CN2-12 grossly intact. Strength 5/5 in upper and lower extremities. Reflexes symmetric and intact bilaterally.  Psych: Normal  affect and thought content  EKG: Sinus bradycardia. No ischemic changes.      Algis Greenhouse. Jerline Pain, MD 04/19/2019 9:25 AM

## 2019-04-19 NOTE — Patient Instructions (Signed)
It was very nice to see you today!  Please let me know if your pain worsens or does not improve the next 2 weeks.  I will refill your cream for your rash.  Will place referral for you to get your vasectomy.  Come back to see me in 1 year for your next physical, or sooner if needed.  Take care, Dr Jerline Pain  Please try these tips to maintain a healthy lifestyle:   Eat at least 3 REAL meals and 1-2 snacks per day.  Aim for no more than 5 hours between eating.  If you eat breakfast, please do so within one hour of getting up.    Obtain twice as many fruits/vegetables as protein or carbohydrate foods for both lunch and dinner. (Half of each meal should be fruits/vegetables, one quarter protein, and one quarter starchy carbs)   Cut down on sweet beverages. This includes juice, soda, and sweet tea.    Exercise at least 150 minutes every week.    Preventive Care 44-31 Years Old, Male Preventive care refers to lifestyle choices and visits with your health care provider that can promote health and wellness. This includes:  A yearly physical exam. This is also called an annual well check.  Regular dental and eye exams.  Immunizations.  Screening for certain conditions.  Healthy lifestyle choices, such as eating a healthy diet, getting regular exercise, not using drugs or products that contain nicotine and tobacco, and limiting alcohol use. What can I expect for my preventive care visit? Physical exam Your health care provider will check:  Height and weight. These may be used to calculate body mass index (BMI), which is a measurement that tells if you are at a healthy weight.  Heart rate and blood pressure.  Your skin for abnormal spots. Counseling Your health care provider may ask you questions about:  Alcohol, tobacco, and drug use.  Emotional well-being.  Home and relationship well-being.  Sexual activity.  Eating habits.  Work and work Statistician. What  immunizations do I need?  Influenza (flu) vaccine  This is recommended every year. Tetanus, diphtheria, and pertussis (Tdap) vaccine  You may need a Td booster every 10 years. Varicella (chickenpox) vaccine  You may need this vaccine if you have not already been vaccinated. Zoster (shingles) vaccine  You may need this after age 44. Measles, mumps, and rubella (MMR) vaccine  You may need at least one dose of MMR if you were born in 1957 or later. You may also need a second dose. Pneumococcal conjugate (PCV13) vaccine  You may need this if you have certain conditions and were not previously vaccinated. Pneumococcal polysaccharide (PPSV23) vaccine  You may need one or two doses if you smoke cigarettes or if you have certain conditions. Meningococcal conjugate (MenACWY) vaccine  You may need this if you have certain conditions. Hepatitis A vaccine  You may need this if you have certain conditions or if you travel or work in places where you may be exposed to hepatitis A. Hepatitis B vaccine  You may need this if you have certain conditions or if you travel or work in places where you may be exposed to hepatitis B. Haemophilus influenzae type b (Hib) vaccine  You may need this if you have certain risk factors. Human papillomavirus (HPV) vaccine  If recommended by your health care provider, you may need three doses over 6 months. You may receive vaccines as individual doses or as more than one vaccine together in one  shot (combination vaccines). Talk with your health care provider about the risks and benefits of combination vaccines. What tests do I need? Blood tests  Lipid and cholesterol levels. These may be checked every 5 years, or more frequently if you are over 44 years old.  Hepatitis C test.  Hepatitis B test. Screening  Lung cancer screening. You may have this screening every year starting at age 44 if you have a 30-pack-year history of smoking and currently smoke  or have quit within the past 15 years.  Prostate cancer screening. Recommendations will vary depending on your family history and other risks.  Colorectal cancer screening. All adults should have this screening starting at age 44 and continuing until age 44. Your health care provider may recommend screening at age 44 if you are at increased risk. You will have tests every 1-10 years, depending on your results and the type of screening test.  Diabetes screening. This is done by checking your blood sugar (glucose) after you have not eaten for a while (fasting). You may have this done every 1-3 years.  Sexually transmitted disease (STD) testing. Follow these instructions at home: Eating and drinking  Eat a diet that includes fresh fruits and vegetables, whole grains, lean protein, and low-fat dairy products.  Take vitamin and mineral supplements as recommended by your health care provider.  Do not drink alcohol if your health care provider tells you not to drink.  If you drink alcohol: ? Limit how much you have to 0-2 drinks a day. ? Be aware of how much alcohol is in your drink. In the [place] one drink equals one 12 oz bottle of beer (355 mL), one 5 oz glass of wine (148 mL), or one 1 oz glass of hard liquor (44 mL). Lifestyle  Take daily care of your teeth and gums.  Stay active. Exercise for at least 30 minutes on 5 or more days each week.  Do not use any products that contain nicotine or tobacco, such as cigarettes, e-cigarettes, and chewing tobacco. If you need help quitting, ask your health care provider.  If you are sexually active, practice safe sex. Use a condom or other form of protection to prevent STIs (sexually transmitted infections).  Talk with your health care provider about taking a low-dose aspirin every day starting at age 44. What's next?  Go to your health care provider once a year for a well check visit.  Ask your health care provider how often you should have  your eyes and teeth checked.  Stay up to date on all vaccines. This information is not intended to replace advice given to you by your health care provider. Make sure you discuss any questions you have with your health care provider. Document Released: 08/10/2015 Document Revised: 07/08/2018 Document Reviewed: 07/08/2018 Elsevier Patient Education  2020 [employer].

## 2019-04-22 ENCOUNTER — Encounter: Payer: Self-pay | Admitting: Family Medicine

## 2019-04-22 DIAGNOSIS — M7711 Lateral epicondylitis, right elbow: Secondary | ICD-10-CM | POA: Diagnosis not present

## 2019-04-25 DIAGNOSIS — I10 Essential (primary) hypertension: Secondary | ICD-10-CM | POA: Diagnosis not present

## 2019-04-25 DIAGNOSIS — R740 Nonspecific elevation of levels of transaminase and lactic acid dehydrogenase [LDH]: Secondary | ICD-10-CM | POA: Diagnosis not present

## 2019-04-25 LAB — BASIC METABOLIC PANEL
BUN: 15 (ref 4–21)
CO2: 27 — AB (ref 13–22)
Chloride: 104 (ref 99–108)
Creatinine: 0.8 (ref 0.6–1.3)
Glucose: 78
Potassium: 4.2 (ref 3.4–5.3)
Sodium: 141 (ref 137–147)

## 2019-04-25 LAB — COMPREHENSIVE METABOLIC PANEL
Albumin: 4.4 (ref 3.5–5.0)
Calcium: 9.1 (ref 8.7–10.7)
GFR calc Af Amer: 125
GFR calc non Af Amer: 108
Globulin: 2.5

## 2019-04-25 LAB — HEPATIC FUNCTION PANEL
ALT: 40 (ref 10–40)
AST: 17 (ref 14–40)
Alkaline Phosphatase: 90 (ref 25–125)
Bilirubin, Total: 0.5

## 2019-04-27 DIAGNOSIS — D225 Melanocytic nevi of trunk: Secondary | ICD-10-CM | POA: Diagnosis not present

## 2019-04-27 DIAGNOSIS — L821 Other seborrheic keratosis: Secondary | ICD-10-CM | POA: Diagnosis not present

## 2019-04-27 DIAGNOSIS — Z85828 Personal history of other malignant neoplasm of skin: Secondary | ICD-10-CM | POA: Diagnosis not present

## 2019-04-27 DIAGNOSIS — Z08 Encounter for follow-up examination after completed treatment for malignant neoplasm: Secondary | ICD-10-CM | POA: Diagnosis not present

## 2019-05-09 DIAGNOSIS — Z23 Encounter for immunization: Secondary | ICD-10-CM | POA: Diagnosis not present

## 2019-05-09 DIAGNOSIS — R7989 Other specified abnormal findings of blood chemistry: Secondary | ICD-10-CM | POA: Diagnosis not present

## 2019-05-19 ENCOUNTER — Encounter: Payer: Self-pay | Admitting: Family Medicine

## 2019-06-28 DIAGNOSIS — M7711 Lateral epicondylitis, right elbow: Secondary | ICD-10-CM | POA: Diagnosis not present

## 2019-07-07 DIAGNOSIS — Z3009 Encounter for other general counseling and advice on contraception: Secondary | ICD-10-CM | POA: Diagnosis not present

## 2019-07-20 ENCOUNTER — Other Ambulatory Visit: Payer: Self-pay

## 2019-07-20 MED ORDER — TRIAMCINOLONE 0.1 % CREAM:EUCERIN CREAM 1:1: 1.0000 "application " | TOPICAL_CREAM | Freq: Two times a day (BID) | CUTANEOUS | 3 refills | Status: DC

## 2019-08-12 DIAGNOSIS — Z302 Encounter for sterilization: Secondary | ICD-10-CM | POA: Diagnosis not present

## 2019-08-15 ENCOUNTER — Ambulatory Visit (INDEPENDENT_AMBULATORY_CARE_PROVIDER_SITE_OTHER): Payer: BC Managed Care – PPO | Admitting: Family Medicine

## 2019-08-15 DIAGNOSIS — R519 Headache, unspecified: Secondary | ICD-10-CM | POA: Diagnosis not present

## 2019-08-15 MED ORDER — DICLOFENAC SODIUM 75 MG PO TBEC: 75.0000 mg | DELAYED_RELEASE_TABLET | Freq: Two times a day (BID) | ORAL | 0 refills | Status: DC

## 2019-08-15 NOTE — Progress Notes (Signed)
   Eugene Mitchell is a 44 y.o. male who presents today for a virtual office visit.  Assessment/Plan:  New/Acute Problems: Headache Likely side effect of nitrous oxide that he received during vasectomy.  Discussed limitations of virtual evaluation and inability to perform neurologic exam.  He does not have any other significant neurologic findings-doubt this represents serious etiology.  Will start diclofenac.  Encouraged good oral hydration recommended at least 7 to 8 hours of sleep per night.  Recommended he use Pataday drops for his watery eyes.    Subjective:  HPI:   Had vasectomy 3 days ago and has had a nagging headache since then. Located in frontal area. Tries taking tylenol with modest improvement. No nausea or vomiting. No weakness or numbness. Nothing like this before. Has been staying well hydrated.  Headache is described as very mild.  Patient was around a smoke quit a few days ago and has had watery eyes since then.  V       Objective/Observations  Physical Exam: Gen: NAD, resting comfortably Pulm: Normal work of breathing Neuro: Grossly normal, moves all extremities Psych: Normal affect and thought content  Virtual Visit via Video   I connected with Eugene Mitchell on 08/15/19 at 11:00 AM EST by a video enabled telemedicine application and verified that I am speaking with the correct person using two identifiers. The limitations of evaluation and management by telemedicine and the availability of in person appointments were discussed. The patient expressed understanding and agreed to proceed.   Patient location: Home Provider location: [place] participating in the virtual visit: Myself and Patient     Algis Greenhouse. Jerline Pain, MD 08/15/2019 10:54 AM

## 2019-09-29 DIAGNOSIS — M7711 Lateral epicondylitis, right elbow: Secondary | ICD-10-CM | POA: Diagnosis not present

## 2019-10-13 ENCOUNTER — Ambulatory Visit: Payer: BC Managed Care – PPO | Attending: Internal Medicine

## 2019-10-13 DIAGNOSIS — Z23 Encounter for immunization: Secondary | ICD-10-CM

## 2019-10-13 NOTE — Progress Notes (Signed)
   Covid-19 Vaccination Clinic  Name:  Eugene Mitchell    MRN: [id number] DOB: 01/08/1976  10/13/2019  [name] was observed post Covid-19 immunization for 15 minutes without incident. He was provided with Vaccine Information Sheet and instruction to access the V-Safe system.   [name] was instructed to call 911 with any severe reactions post vaccine: Marland Kitchen Difficulty breathing  . Swelling of face and throat  . A fast heartbeat  . A bad rash all over body  . Dizziness and weakness   Immunizations Administered    Name Date Dose VIS Date Route   Pfizer COVID-19 Vaccine 10/13/2019 10:02 AM 0.3 mL 07/08/2019 Intramuscular   Manufacturer: [place]   Lot: [id number]   [place]: [id number]

## 2019-10-26 DIAGNOSIS — B078 Other viral warts: Secondary | ICD-10-CM | POA: Diagnosis not present

## 2019-10-26 DIAGNOSIS — L570 Actinic keratosis: Secondary | ICD-10-CM | POA: Diagnosis not present

## 2019-10-26 DIAGNOSIS — L821 Other seborrheic keratosis: Secondary | ICD-10-CM | POA: Diagnosis not present

## 2019-10-26 DIAGNOSIS — L565 Disseminated superficial actinic porokeratosis (DSAP): Secondary | ICD-10-CM | POA: Diagnosis not present

## 2019-11-02 LAB — LIPID PANEL
Cholesterol: 126 (ref 0–200)
HDL: 39 (ref 35–70)
LDL Cholesterol: 66
Triglycerides: 125 (ref 40–160)

## 2019-11-02 LAB — TSH: TSH: 0.84 (ref 0.41–5.90)

## 2019-11-02 LAB — CBC AND DIFFERENTIAL
HCT: 44 (ref 41–53)
Hemoglobin: 14.4 (ref 13.5–17.5)
Neutrophils Absolute: 66
Platelets: 240 (ref 150–399)
WBC: 6.1

## 2019-11-02 LAB — COMPREHENSIVE METABOLIC PANEL
Albumin: 4.4 (ref 3.5–5.0)
Calcium: 9 (ref 8.7–10.7)
GFR calc Af Amer: 131
GFR calc non Af Amer: 113
Globulin: 2.5

## 2019-11-02 LAB — BASIC METABOLIC PANEL
BUN: 10 (ref 4–21)
CO2: 26 — AB (ref 13–22)
Chloride: 105 (ref 99–108)
Creatinine: 0.7 (ref 0.6–1.3)
Glucose: 101
Potassium: 4.4 (ref 3.4–5.3)
Sodium: 139 (ref 137–147)

## 2019-11-02 LAB — VITAMIN D 25 HYDROXY (VIT D DEFICIENCY, FRACTURES): Vit D, 25-Hydroxy: 34

## 2019-11-02 LAB — CBC: RBC: 5.08 (ref 3.87–5.11)

## 2019-11-02 LAB — VITAMIN B12: Vitamin B-12: 333

## 2019-11-02 LAB — PSA: PSA: 0.4

## 2019-11-02 LAB — HEPATIC FUNCTION PANEL
ALT: 54 — AB (ref 10–40)
AST: 22 (ref 14–40)
Alkaline Phosphatase: 96 (ref 25–125)
Bilirubin, Total: 0.4

## 2019-11-02 LAB — HEMOGLOBIN A1C: Hemoglobin A1C: 5.7

## 2019-11-09 ENCOUNTER — Ambulatory Visit: Payer: BC Managed Care – PPO | Attending: Internal Medicine

## 2019-11-09 DIAGNOSIS — Z23 Encounter for immunization: Secondary | ICD-10-CM

## 2019-11-09 NOTE — Progress Notes (Signed)
   Covid-19 Vaccination Clinic  Name:  ANANDA GIUSTO    MRN: [id number] DOB: [birth year]  11/09/2019  Mr. Sulcer was observed post Covid-19 immunization for 15 minutes without incident. He was provided with Vaccine Information Sheet and instruction to access the V-Safe system.   Mr. Reichner was instructed to call 911 with any severe reactions post vaccine: Marland Kitchen Difficulty breathing  . Swelling of face and throat  . A fast heartbeat  . A bad rash all over body  . Dizziness and weakness   Immunizations Administered    Name Date Dose VIS Date Route   Pfizer COVID-19 Vaccine 11/09/2019  9:43 AM 0.3 mL 07/08/2019 Intramuscular   Manufacturer: [place]   Lot: [id number]   [place]: [id number]

## 2019-11-24 DIAGNOSIS — M7711 Lateral epicondylitis, right elbow: Secondary | ICD-10-CM | POA: Diagnosis not present

## 2019-11-24 DIAGNOSIS — M25521 Pain in right elbow: Secondary | ICD-10-CM | POA: Diagnosis not present

## 2019-12-07 DIAGNOSIS — M7711 Lateral epicondylitis, right elbow: Secondary | ICD-10-CM | POA: Diagnosis not present

## 2019-12-13 DIAGNOSIS — M7711 Lateral epicondylitis, right elbow: Secondary | ICD-10-CM | POA: Diagnosis not present

## 2019-12-28 LAB — BASIC METABOLIC PANEL
BUN: 11 (ref 4–21)
CO2: 28 — AB (ref 13–22)
Chloride: 103 (ref 99–108)
Creatinine: 0.7 (ref 0.6–1.3)
Glucose: 93
Potassium: 4.3 (ref 3.4–5.3)
Sodium: 138 (ref 137–147)

## 2019-12-28 LAB — COMPREHENSIVE METABOLIC PANEL
Albumin: 4.4 (ref 3.5–5.0)
Calcium: 9.4 (ref 8.7–10.7)
GFR calc Af Amer: 130
GFR calc non Af Amer: 112
Globulin: 2.5

## 2019-12-28 LAB — HEPATIC FUNCTION PANEL
ALT: 36 (ref 10–40)
AST: 18 (ref 14–40)
Alkaline Phosphatase: 94 (ref 25–125)
Bilirubin, Total: 0.5

## 2019-12-28 LAB — VITAMIN B12: Vitamin B-12: 304

## 2020-01-18 ENCOUNTER — Encounter: Payer: Self-pay | Admitting: Family Medicine

## 2020-02-07 DIAGNOSIS — G5601 Carpal tunnel syndrome, right upper limb: Secondary | ICD-10-CM | POA: Diagnosis not present

## 2020-02-07 DIAGNOSIS — M7711 Lateral epicondylitis, right elbow: Secondary | ICD-10-CM | POA: Diagnosis not present

## 2020-02-20 DIAGNOSIS — G5601 Carpal tunnel syndrome, right upper limb: Secondary | ICD-10-CM | POA: Diagnosis not present

## 2020-03-06 DIAGNOSIS — M7711 Lateral epicondylitis, right elbow: Secondary | ICD-10-CM | POA: Diagnosis not present

## 2020-03-06 DIAGNOSIS — G5601 Carpal tunnel syndrome, right upper limb: Secondary | ICD-10-CM | POA: Diagnosis not present

## 2020-04-12 DIAGNOSIS — M7711 Lateral epicondylitis, right elbow: Secondary | ICD-10-CM | POA: Diagnosis not present

## 2020-04-12 DIAGNOSIS — G5601 Carpal tunnel syndrome, right upper limb: Secondary | ICD-10-CM | POA: Diagnosis not present

## 2020-04-23 DIAGNOSIS — G8918 Other acute postprocedural pain: Secondary | ICD-10-CM | POA: Diagnosis not present

## 2020-04-23 DIAGNOSIS — G5601 Carpal tunnel syndrome, right upper limb: Secondary | ICD-10-CM | POA: Diagnosis not present

## 2020-04-23 DIAGNOSIS — M7711 Lateral epicondylitis, right elbow: Secondary | ICD-10-CM | POA: Diagnosis not present

## 2020-05-08 DIAGNOSIS — M25521 Pain in right elbow: Secondary | ICD-10-CM | POA: Diagnosis not present

## 2020-05-08 DIAGNOSIS — G5601 Carpal tunnel syndrome, right upper limb: Secondary | ICD-10-CM | POA: Diagnosis not present

## 2020-05-15 DIAGNOSIS — M79641 Pain in right hand: Secondary | ICD-10-CM | POA: Diagnosis not present

## 2020-05-15 DIAGNOSIS — M25521 Pain in right elbow: Secondary | ICD-10-CM | POA: Diagnosis not present

## 2020-05-16 LAB — LIPID PANEL
Cholesterol: 141 (ref 0–200)
HDL: 42 (ref 35–70)
LDL Cholesterol: 66
Triglycerides: 279 — AB (ref 40–160)

## 2020-05-16 LAB — BASIC METABOLIC PANEL: Glucose: 92

## 2020-05-24 DIAGNOSIS — M25521 Pain in right elbow: Secondary | ICD-10-CM | POA: Diagnosis not present

## 2020-05-24 DIAGNOSIS — M79641 Pain in right hand: Secondary | ICD-10-CM | POA: Diagnosis not present

## 2020-05-29 DIAGNOSIS — M25521 Pain in right elbow: Secondary | ICD-10-CM | POA: Diagnosis not present

## 2020-06-07 DIAGNOSIS — M25521 Pain in right elbow: Secondary | ICD-10-CM | POA: Diagnosis not present

## 2020-06-11 DIAGNOSIS — Z85828 Personal history of other malignant neoplasm of skin: Secondary | ICD-10-CM | POA: Diagnosis not present

## 2020-06-11 DIAGNOSIS — L565 Disseminated superficial actinic porokeratosis (DSAP): Secondary | ICD-10-CM | POA: Diagnosis not present

## 2020-06-15 DIAGNOSIS — M25521 Pain in right elbow: Secondary | ICD-10-CM | POA: Diagnosis not present

## 2020-06-15 DIAGNOSIS — M79641 Pain in right hand: Secondary | ICD-10-CM | POA: Diagnosis not present

## 2020-06-18 LAB — BASIC METABOLIC PANEL WITH GFR
BUN: 12 (ref 4–21)
CO2: 25 — AB (ref 13–22)
Chloride: 102 (ref 99–108)
Creatinine: 0.8 (ref 0.6–1.3)
Glucose: 102
Potassium: 4.3 (ref 3.4–5.3)
Sodium: 139 (ref 137–147)

## 2020-06-18 LAB — LIPID PANEL
Cholesterol: 129 (ref 0–200)
HDL: 44 (ref 35–70)
LDL Cholesterol: 60
Triglycerides: 173 — AB (ref 40–160)

## 2020-06-18 LAB — COMPREHENSIVE METABOLIC PANEL
Albumin: 4.6 (ref 3.5–5.0)
Calcium: 9.6 (ref 8.7–10.7)
GFR calc Af Amer: 126
GFR calc non Af Amer: 108
Globulin: 2.8

## 2020-06-18 LAB — HEPATIC FUNCTION PANEL
ALT: 53 — AB (ref 10–40)
AST: 21 (ref 14–40)
Alkaline Phosphatase: 88 (ref 25–125)

## 2020-06-18 LAB — VITAMIN B12: Vitamin B-12: 311

## 2020-06-18 LAB — HEMOGLOBIN A1C: Hemoglobin A1C: 5.8

## 2020-06-29 DIAGNOSIS — M79641 Pain in right hand: Secondary | ICD-10-CM | POA: Diagnosis not present

## 2020-06-29 DIAGNOSIS — M25521 Pain in right elbow: Secondary | ICD-10-CM | POA: Diagnosis not present

## 2020-07-02 LAB — CBC: RBC: 5.34 — AB (ref 3.87–5.11)

## 2020-07-02 LAB — CBC AND DIFFERENTIAL
HCT: 46 (ref 41–53)
Hemoglobin: 15.2 (ref 13.5–17.5)
Neutrophils Absolute: 4703
Platelets: 231 (ref 150–399)
WBC: 6.7

## 2020-07-02 LAB — TESTOSTERONE: Testosterone: 393

## 2020-07-02 LAB — VITAMIN D 25 HYDROXY (VIT D DEFICIENCY, FRACTURES): Vit D, 25-Hydroxy: 38

## 2020-07-02 LAB — IRON,TIBC AND FERRITIN PANEL: Iron: 83

## 2020-07-02 LAB — TSH: TSH: 1.11 (ref <=5.90)

## 2020-07-05 DIAGNOSIS — M25521 Pain in right elbow: Secondary | ICD-10-CM | POA: Diagnosis not present

## 2020-07-05 DIAGNOSIS — M79641 Pain in right hand: Secondary | ICD-10-CM | POA: Diagnosis not present

## 2020-07-13 DIAGNOSIS — M25521 Pain in right elbow: Secondary | ICD-10-CM | POA: Diagnosis not present

## 2020-08-07 ENCOUNTER — Encounter: Payer: Self-pay | Admitting: Family Medicine

## 2020-08-07 ENCOUNTER — Ambulatory Visit (INDEPENDENT_AMBULATORY_CARE_PROVIDER_SITE_OTHER): Payer: BC Managed Care – PPO | Admitting: Family Medicine

## 2020-08-07 ENCOUNTER — Other Ambulatory Visit: Payer: Self-pay

## 2020-08-07 VITALS — BP 119/73 | HR 78 | Temp 98.5°F | Ht 68.0 in | Wt 257.8 lb

## 2020-08-07 DIAGNOSIS — Z6839 Body mass index (BMI) 39.0-39.9, adult: Secondary | ICD-10-CM

## 2020-08-07 DIAGNOSIS — I10 Essential (primary) hypertension: Secondary | ICD-10-CM

## 2020-08-07 DIAGNOSIS — E785 Hyperlipidemia, unspecified: Secondary | ICD-10-CM

## 2020-08-07 DIAGNOSIS — Z1211 Encounter for screening for malignant neoplasm of colon: Secondary | ICD-10-CM

## 2020-08-07 DIAGNOSIS — Z0001 Encounter for general adult medical examination with abnormal findings: Secondary | ICD-10-CM

## 2020-08-07 DIAGNOSIS — E669 Obesity, unspecified: Secondary | ICD-10-CM

## 2020-08-07 DIAGNOSIS — F321 Major depressive disorder, single episode, moderate: Secondary | ICD-10-CM | POA: Diagnosis not present

## 2020-08-07 DIAGNOSIS — R21 Rash and other nonspecific skin eruption: Secondary | ICD-10-CM

## 2020-08-07 MED ORDER — CITALOPRAM HYDROBROMIDE 40 MG PO TABS: 40.0000 mg | ORAL_TABLET | Freq: Every day | ORAL | 3 refills | Status: DC

## 2020-08-07 MED ORDER — TRIAMCINOLONE 0.1 % CREAM:EUCERIN CREAM 1:1: 1.0000 | TOPICAL_CREAM | Freq: Two times a day (BID) | CUTANEOUS | 3 refills | Status: DC

## 2020-08-07 NOTE — Assessment & Plan Note (Signed)
Elevated PHQ score today.  He is starting seeing a counselor which is helped.  His physicians assistant at work and started him on Cymbalta but he does not think this is helped significantly.  We will switch to celexa 40mg  daily we will check in in a few weeks via MyChart.

## 2020-08-07 NOTE — Assessment & Plan Note (Signed)
At goal  Continue lisinopril 10mg daily

## 2020-08-07 NOTE — Assessment & Plan Note (Signed)
Stable we will refill triamcinolone-Eucerin cream.

## 2020-08-07 NOTE — Assessment & Plan Note (Signed)
Had lipids checked at work.  Will continue Lipitor 20 mg daily.  He will drop off most recent copy of labs soon.

## 2020-08-07 NOTE — Progress Notes (Signed)
Chief Complaint:  ANTHONNY Mitchell is a 45 y.o. male who presents today for his annual comprehensive physical exam.    Assessment/Plan:  Chronic Problems Addressed Today: Hyperlipidemia Had lipids checked at work.  Will continue Lipitor 20 mg daily.  He will drop off most recent copy of labs soon.  Hypertension At goal.  Continue lisinopril 10 mg daily.  Depression, major, single episode, moderate (HCC) Elevated PHQ score today.  He is starting seeing a counselor which is helped.  His physicians assistant at work and started him on Cymbalta but he does not think this is helped significantly.  We will switch to celexa 40mg  daily we will check in in a few weeks via MyChart.  Rash Stable we will refill triamcinolone-Eucerin cream.   Body mass index is 39.2 kg/m. / Obese  BMI Metric Follow Up - 08/07/20 1045      BMI Metric Follow Up-Please document annually   BMI Metric Follow Up Education provided           Preventative Healthcare: Will place referral for colonoscopy.  Has had labs done recently.  Patient Counseling(The following topics were reviewed and/or handout was given):  -Nutrition: Stressed importance of moderation in sodium/caffeine intake, saturated fat and cholesterol, caloric balance, sufficient intake of fresh fruits, vegetables, and fiber.  -Stressed the importance of regular exercise.   -Substance Abuse: Discussed cessation/primary prevention of tobacco, alcohol, or other drug use; driving or other dangerous activities under the influence; availability of treatment for abuse.   -Injury prevention: Discussed safety belts, safety helmets, smoke detector, smoking near bedding or upholstery.   -Sexuality: Discussed sexually transmitted diseases, partner selection, use of condoms, avoidance of unintended pregnancy and contraceptive alternatives.   -Dental health: Discussed importance of regular tooth brushing, flossing, and dental visits.  -Health maintenance and  immunizations reviewed. Please refer to Health maintenance section.  Return to care in 1 year for next preventative visit.     Subjective:  HPI:  He has no acute complaints today. See A/p for status of chronic conditions.   Lifestyle Diet: None specific.  Exercise: None specific. Trying to walk more.   Depression screen PHQ 2/9 04/19/2019  Decreased Interest 0  Down, Depressed, Hopeless 0  PHQ - 2 Score 0    Health Maintenance Due  Topic Date Due  . Hepatitis C Screening  Never done  . HIV Screening  Never done  . COLONOSCOPY (Pts 45-22yrs Insurance coverage will need to be confirmed)  Never done     ROS: Per HPI, otherwise a complete review of systems was negative.   PMH:  The following were reviewed and entered/updated in epic: Past Medical History:  Diagnosis Date  . Depression   . Hypercholesteremia   . Hypertension   . TIA (transient ischemic attack)    Patient Active Problem List   Diagnosis Date Noted  . Depression, major, single episode, moderate ([place]) 08/07/2020  . Rash 02/04/2019  . Skin cancer 07/07/2018  . Hyperlipidemia 10/18/2014  . Hypertension 12/30/2012   Past Surgical History:  Procedure Laterality Date  . CHOLECYSTECTOMY    . ELBOW SURGERY Left     Family History  Problem Relation Age of Onset  . Thyroid disease Mother   . Cancer Mother        Lung  . Stroke Father   . Coronary artery disease Father   . Cancer Father        Lung    Medications- reviewed and updated Current Outpatient  Medications  Medication Sig Dispense Refill  . atorvastatin (LIPITOR) 20 MG tablet Take 1 tablet (20 mg total) by mouth daily at 6 PM. 30 tablet 0  . citalopram (CELEXA) 40 MG tablet Take 1 tablet (40 mg total) by mouth daily. 30 tablet 3  . lisinopril (PRINIVIL,ZESTRIL) 10 MG tablet Take 10 mg by mouth daily.    . Triamcinolone Acetonide (TRIAMCINOLONE 0.1 % CREAM : EUCERIN) CREA Apply 1 application topically 2 (two) times daily. 1 each 3   No  current facility-administered medications for this visit.    Allergies-reviewed and updated Allergies  Allergen Reactions  . Esomeprazole Swelling    Social History   Socioeconomic History  . Marital status: Significant Other    Spouse name: Not on file  . Number of children: 2  . Years of education: 57  . Highest education level: Not on file  Occupational History  . Occupation: Physiological scientist  Tobacco Use  . Smoking status: Former Research scientist (life sciences)  . Smokeless tobacco: Never Used  Vaping Use  . Vaping Use: Never used  Substance and Sexual Activity  . Alcohol use: Yes    Alcohol/week: 0.0 standard drinks    Comment: Social  . Drug use: No  . Sexual activity: Yes    Partners: Male  Other Topics Concern  . Not on file  Social History Narrative   Lives at home with his parents.   Left-handed.   3-6 cups caffeine daily.   Social Determinants of Health   Financial Resource Strain: Not on file  Food Insecurity: Not on file  Transportation Needs: Not on file  Physical Activity: Not on file  Stress: Not on file  Social Connections: Not on file        Objective:  Physical Exam: BP 119/73   Pulse 78   Temp 98.5 F (36.9 C) (Temporal)   Ht 5\' 8"  (1.727 m)   Wt 257 lb 12.8 oz (116.9 kg)   SpO2 96%   BMI 39.20 kg/m   Body mass index is 39.2 kg/m. Wt Readings from Last 3 Encounters:  08/07/20 257 lb 12.8 oz (116.9 kg)  04/19/19 257 lb 4 oz (116.7 kg)  02/04/19 256 lb 6.4 oz (116.3 kg)   Gen: NAD, resting comfortably HEENT: TMs normal bilaterally. OP clear. No thyromegaly noted.  CV: RRR with no murmurs appreciated Pulm: NWOB, CTAB with no crackles, wheezes, or rhonchi GI: Normal bowel sounds present. Soft, Nontender, Nondistended. MSK: no edema, cyanosis, or clubbing noted Skin: warm, dry Neuro: CN2-12 grossly intact. Strength 5/5 in upper and lower extremities. Reflexes symmetric and intact bilaterally.  Psych: Normal affect and thought content     Eugene Mitchell M.  Jerline Pain, MD 08/07/2020 10:45 AM

## 2020-08-07 NOTE — Patient Instructions (Signed)
It was very nice to see you today!  Please stop the fluoxetine.  We will start the Celexa.  Send me a message in a few weeks to let me know how it is working for you.  I will refill the cream for your rash.  We will get you set up for colonoscopy.  I will see you back in year.  Please come back to see me sooner if needed.  Take care, Dr Jerline Pain  Please try these tips to maintain a healthy lifestyle:   Eat at least 3 REAL meals and 1-2 snacks per day.  Aim for no more than 5 hours between eating.  If you eat breakfast, please do so within one hour of getting up.    Each meal should contain half fruits/vegetables, one quarter protein, and one quarter carbs (no bigger than a computer mouse)   Cut down on sweet beverages. This includes juice, soda, and sweet tea.     Drink at least 1 glass of water with each meal and aim for at least 8 glasses per day   Exercise at least 150 minutes every week.    Preventive Care 70-47 Years Old, Male Preventive care refers to lifestyle choices and visits with your health care provider that can promote health and wellness. This includes:  A yearly physical exam. This is also called an annual wellness visit.  Regular dental and eye exams.  Immunizations.  Screening for certain conditions.  Healthy lifestyle choices, such as: ? Eating a healthy diet. ? Getting regular exercise. ? Not using drugs or products that contain nicotine and tobacco. ? Limiting alcohol use. What can I expect for my preventive care visit? Physical exam Your health care provider will check your:  Height and weight. These may be used to calculate your BMI (body mass index). BMI is a measurement that tells if you are at a healthy weight.  Heart rate and blood pressure.  Body temperature.  Skin for abnormal spots. Counseling Your health care provider may ask you questions about your:  Past medical problems.  Family's medical history.  Alcohol, tobacco,  and drug use.  Emotional well-being.  Home life and relationship well-being.  Sexual activity.  Diet, exercise, and sleep habits.  Work and work Statistician.  Access to firearms. What immunizations do I need? Vaccines are usually given at various ages, according to a schedule. Your health care provider will recommend vaccines for you based on your age, medical history, and lifestyle or other factors, such as travel or where you work.   What tests do I need? Blood tests  Lipid and cholesterol levels. These may be checked every 5 years, or more often if you are over 92 years old.  Hepatitis C test.  Hepatitis B test. Screening  Lung cancer screening. You may have this screening every year starting at age 16 if you have a 30-pack-year history of smoking and currently smoke or have quit within the past 15 years.  Prostate cancer screening. Recommendations will vary depending on your family history and other risks.  Genital exam to check for testicular cancer or hernias.  Colorectal cancer screening. ? All adults should have this screening starting at age 16 and continuing until age 25. ? Your health care provider may recommend screening at age 53 if you are at increased risk. ? You will have tests every 1-10 years, depending on your results and the type of screening test.  Diabetes screening. ? This is done by checking your  blood sugar (glucose) after you have not eaten for a while (fasting). ? You may have this done every 1-3 years.  STD (sexually transmitted disease) testing, if you are at risk. Follow these instructions at home: Eating and drinking  Eat a diet that includes fresh fruits and vegetables, whole grains, lean protein, and low-fat dairy products.  Take vitamin and mineral supplements as recommended by your health care provider.  Do not drink alcohol if your health care provider tells you not to drink.  If you drink alcohol: ? Limit how much you have to 0-2  drinks a day. ? Be aware of how much alcohol is in your drink. In the [place] one drink equals one 12 oz bottle of beer (355 mL), one 5 oz glass of wine (148 mL), or one 1 oz glass of hard liquor (44 mL).   Lifestyle  Take daily care of your teeth and gums. Brush your teeth every morning and night with fluoride toothpaste. Floss one time each day.  Stay active. Exercise for at least 30 minutes 5 or more days each week.  Do not use any products that contain nicotine or tobacco, such as cigarettes, e-cigarettes, and chewing tobacco. If you need help quitting, ask your health care provider.  Do not use drugs.  If you are sexually active, practice safe sex. Use a condom or other form of protection to prevent STIs (sexually transmitted infections).  If told by your health care provider, take low-dose aspirin daily starting at age 20.  Find healthy ways to cope with stress, such as: ? Meditation, yoga, or listening to music. ? Journaling. ? Talking to a trusted person. ? Spending time with friends and family. Safety  Always wear your seat belt while driving or riding in a vehicle.  Do not drive: ? If you have been drinking alcohol. Do not ride with someone who has been drinking. ? When you are tired or distracted. ? While texting.  Wear a helmet and other protective equipment during sports activities.  If you have firearms in your house, make sure you follow all gun safety procedures. What's next?  Go to your health care provider once a year for an annual wellness visit.  Ask your health care provider how often you should have your eyes and teeth checked.  Stay up to date on all vaccines. This information is not intended to replace advice given to you by your health care provider. Make sure you discuss any questions you have with your health care provider. Document Revised: 04/12/2019 Document Reviewed: 07/08/2018 Elsevier Patient Education  2021 [employer].

## 2020-08-21 DIAGNOSIS — B356 Tinea cruris: Secondary | ICD-10-CM | POA: Diagnosis not present

## 2020-09-26 DIAGNOSIS — L565 Disseminated superficial actinic porokeratosis (DSAP): Secondary | ICD-10-CM | POA: Diagnosis not present

## 2020-10-05 ENCOUNTER — Ambulatory Visit (AMBULATORY_SURGERY_CENTER): Payer: Self-pay

## 2020-10-05 ENCOUNTER — Other Ambulatory Visit: Payer: Self-pay

## 2020-10-05 VITALS — Ht 68.0 in | Wt 243.3 lb

## 2020-10-05 DIAGNOSIS — Z1211 Encounter for screening for malignant neoplasm of colon: Secondary | ICD-10-CM

## 2020-10-05 MED ORDER — PLENVU 140 G PO SOLR: 1.0000 | ORAL | 0 refills | Status: DC

## 2020-10-05 NOTE — Progress Notes (Signed)
No egg or soy allergy known to patient  No issues with past sedation with any surgeries or procedures No intubation problems in the past  No FH of Malignant Hyperthermia No diet pills per patient No home 02 use per patient  No blood thinners per patient  Pt denies issues with constipation  No A fib or A flutter  EMMI video to pt or via [place] 19 guidelines implemented in PV today with Pt and RN  Pt is fully vaccinated  for Covid   Plenvu Coupon given to pt in PV today , Code to Pharmacy and  NO PA's for preps discussed with pt In PV today  Discussed with pt there will be an out-of-pocket cost for prep and that varies from $0 to 70 dollars   Due to the COVID-19 pandemic we are asking patients to follow certain guidelines.  Pt aware of COVID protocols and [facility] guidelines

## 2020-10-08 ENCOUNTER — Encounter: Payer: Self-pay | Admitting: Gastroenterology

## 2020-10-19 ENCOUNTER — Other Ambulatory Visit: Payer: Self-pay

## 2020-10-19 ENCOUNTER — Ambulatory Visit (AMBULATORY_SURGERY_CENTER): Payer: BC Managed Care – PPO | Admitting: Gastroenterology

## 2020-10-19 ENCOUNTER — Encounter: Payer: Self-pay | Admitting: Gastroenterology

## 2020-10-19 VITALS — BP 127/88 | HR 75 | Temp 95.9°F | Resp 21 | Ht 68.0 in | Wt 243.0 lb

## 2020-10-19 DIAGNOSIS — Z1211 Encounter for screening for malignant neoplasm of colon: Secondary | ICD-10-CM

## 2020-10-19 DIAGNOSIS — D120 Benign neoplasm of cecum: Secondary | ICD-10-CM | POA: Diagnosis not present

## 2020-10-19 DIAGNOSIS — D123 Benign neoplasm of transverse colon: Secondary | ICD-10-CM | POA: Diagnosis not present

## 2020-10-19 MED ORDER — SODIUM CHLORIDE 0.9 % IV SOLN: 500.0000 mL | Freq: Once | INTRAVENOUS | Status: DC

## 2020-10-19 NOTE — Progress Notes (Signed)
VS-Painter  Pt's states no medical or surgical changes since previsit or office visit.  

## 2020-10-19 NOTE — Progress Notes (Signed)
Called to room to assist during endoscopic procedure.  Patient ID and intended procedure confirmed with present staff. Received instructions for my participation in the procedure from the performing physician.  

## 2020-10-19 NOTE — Patient Instructions (Addendum)
Handouts were given to your care partner on polyps and hemorrhoids. You may resume your current medications today. Await biopsy results.  May take 1-3 weeks to receive pathology results. Please call if any questions or concerns.    YOU HAD AN ENDOSCOPIC PROCEDURE TODAY AT [facility]:   Refer to the procedure report that was given to you for any specific questions about what was found during the examination.  If the procedure report does not answer your questions, please call your gastroenterologist to clarify.  If you requested that your care partner not be given the details of your procedure findings, then the procedure report has been included in a sealed envelope for you to review at your convenience later.  YOU SHOULD EXPECT: Some feelings of bloating in the abdomen. Passage of more gas than usual.  Walking can help get rid of the air that was put into your GI tract during the procedure and reduce the bloating. If you had a lower endoscopy (such as a colonoscopy or flexible sigmoidoscopy) you may notice spotting of blood in your stool or on the toilet paper. If you underwent a bowel prep for your procedure, you may not have a normal bowel movement for a few days.  Please Note:  You might notice some irritation and congestion in your nose or some drainage.  This is from the oxygen used during your procedure.  There is no need for concern and it should clear up in a day or so.  SYMPTOMS TO REPORT IMMEDIATELY:   Following lower endoscopy (colonoscopy or flexible sigmoidoscopy):  Excessive amounts of blood in the stool  Significant tenderness or worsening of abdominal pains  Swelling of the abdomen that is new, acute  Fever of 100F or higher   For urgent or emergent issues, a gastroenterologist can be reached at any hour by calling [phone]. Do not use MyChart messaging for urgent concerns.    DIET:  We do recommend a small meal at first, but then you may proceed  to your regular diet.  Drink plenty of fluids but you should avoid alcoholic beverages for 24 hours.  ACTIVITY:  You should plan to take it easy for the rest of today and you should NOT DRIVE or use heavy machinery until tomorrow (because of the sedation medicines used during the test).    FOLLOW UP: Our staff will call the number listed on your records 48-72 hours following your procedure to check on you and address any questions or concerns that you may have regarding the information given to you following your procedure. If we do not reach you, we will leave a message.  We will attempt to reach you two times.  During this call, we will ask if you have developed any symptoms of COVID 19. If you develop any symptoms (ie: fever, flu-like symptoms, shortness of breath, cough etc.) before then, please call [phone].  If you test positive for Covid 19 in the 2 weeks post procedure, please call and report this information to [place].    If any biopsies were taken you will be contacted by phone or by letter within the next 1-3 weeks.  Please call us at [phone] if you have not heard about the biopsies in 3 weeks.    SIGNATURES/CONFIDENTIALITY: You and/or your care partner have signed paperwork which will be entered into your electronic medical record.  These signatures attest to the fact that that the information above on your After Visit Summary  has been reviewed and is understood.  Full responsibility of the confidentiality of this discharge information lies with you and/or your care-partner. 

## 2020-10-19 NOTE — Progress Notes (Signed)
No problems noted in the recovery room. maw 

## 2020-10-19 NOTE — Progress Notes (Signed)
Report to PACU, RN, vss, BBS= Clear.  

## 2020-10-19 NOTE — Op Note (Signed)
Eugene Mitchell Patient Name: Eugene Mitchell Procedure Date: 10/19/2020 10:10 AM MRN: [id number] Endoscopist: Ladene Artist , MD Age: 45 Referring MD:  Date of Birth: 04/30/1976 Gender: Male Account #: [id number] Procedure:                Colonoscopy Indications:              Screening for colorectal malignant neoplasm Medicines:                Monitored Anesthesia Care Procedure:                Pre-Anesthesia Assessment:                           - Prior to the procedure, a History and Physical                            was performed, and patient medications and                            allergies were reviewed. The patient's tolerance of                            previous anesthesia was also reviewed. The risks                            and benefits of the procedure and the sedation                            options and risks were discussed with the patient.                            All questions were answered, and informed consent                            was obtained. Prior Anticoagulants: The patient has                            taken no previous anticoagulant or antiplatelet                            agents. ASA Grade Assessment: II - A patient with                            mild systemic disease. After reviewing the risks                            and benefits, the patient was deemed in                            satisfactory condition to undergo the procedure.                           After obtaining informed consent, the colonoscope  was passed under direct vision. Throughout the                            procedure, the patient's blood pressure, pulse, and                            oxygen saturations were monitored continuously. The                            Olympus [id number]) Colonoscope was                            introduced through the anus and advanced to the the                            cecum, identified  by appendiceal orifice and                            ileocecal valve. The ileocecal valve, appendiceal                            orifice, and rectum were photographed. The quality                            of the bowel preparation was good. The colonoscopy                            was performed without difficulty. The patient                            tolerated the procedure well. Scope In: 10:27:38 AM Scope Out: 10:47:53 AM Scope Withdrawal Time: 0 hours 16 minutes 16 seconds  Total Procedure Duration: 0 hours 20 minutes 15 seconds  Findings:                 The perianal and digital rectal examinations were                            normal.                           Three sessile polyps were found in the transverse                            colon(2) and cecum (1). The polyps were 5 to 7 mm                            in size. These polyps were removed with a cold                            snare. Resection and retrieval were complete.                           Multiple small-mouthed diverticula were found in  the left colon.                           Internal hemorrhoids were found during                            retroflexion. The hemorrhoids were small and Grade                            I (internal hemorrhoids that do not prolapse).                           The exam was otherwise without abnormality on                            direct and retroflexion views. Complications:            No immediate complications. Estimated blood loss:                            None. Estimated Blood Loss:     Estimated blood loss: none. Impression:               - Three 5 to 7 mm polyps in the transverse colon                            and in the cecum, removed with a cold snare.                            Resected and retrieved.                           - Mild left colon diverticulosis.                           - Internal hemorrhoids.                            - The examination was otherwise normal on direct                            and retroflexion views. Recommendation:           - Repeat colonoscopy after studies are complete for                            surveillance based on pathology results.                           - Patient has a contact number available for                            emergencies. The signs and symptoms of potential                            delayed complications were discussed with the  patient. Return to normal activities tomorrow.                            Written discharge instructions were provided to the                            patient.                           - High fiber diet.                           - Continue present medications.                           - Await pathology results. Ladene Artist, MD 10/19/2020 10:58:44 AM This report has been signed electronically.

## 2020-10-23 ENCOUNTER — Telehealth: Payer: Self-pay

## 2020-10-23 ENCOUNTER — Telehealth: Payer: Self-pay | Admitting: *Deleted

## 2020-10-23 NOTE — Telephone Encounter (Signed)
NO ANSWER, MESSAGE LEFT FOR PATIENT. 

## 2020-10-23 NOTE — Telephone Encounter (Signed)
Attempted 2nd f/u phone call. No answer. Left message.  °

## 2020-10-31 ENCOUNTER — Encounter: Payer: Self-pay | Admitting: Gastroenterology

## 2020-11-30 ENCOUNTER — Other Ambulatory Visit: Payer: Self-pay

## 2020-11-30 ENCOUNTER — Ambulatory Visit: Payer: BC Managed Care – PPO | Admitting: Family Medicine

## 2020-11-30 ENCOUNTER — Encounter: Payer: Self-pay | Admitting: Family Medicine

## 2020-11-30 VITALS — BP 105/66 | HR 58 | Temp 98.1°F | Ht 68.0 in | Wt 232.0 lb

## 2020-11-30 DIAGNOSIS — E669 Obesity, unspecified: Secondary | ICD-10-CM | POA: Diagnosis not present

## 2020-11-30 DIAGNOSIS — F321 Major depressive disorder, single episode, moderate: Secondary | ICD-10-CM | POA: Diagnosis not present

## 2020-11-30 DIAGNOSIS — R109 Unspecified abdominal pain: Secondary | ICD-10-CM

## 2020-11-30 DIAGNOSIS — R21 Rash and other nonspecific skin eruption: Secondary | ICD-10-CM

## 2020-11-30 DIAGNOSIS — I10 Essential (primary) hypertension: Secondary | ICD-10-CM | POA: Diagnosis not present

## 2020-11-30 DIAGNOSIS — R1032 Left lower quadrant pain: Secondary | ICD-10-CM

## 2020-11-30 NOTE — Progress Notes (Signed)
   Eugene Mitchell is a 45 y.o. male who presents today for an office visit.  Assessment/Plan:  New/Acute Problems: Left lower quadrant abdominal pain Concern for possible inguinal hernia given worsening with Valsalva however not appreciated on exam today.  No current red flag signs or symptoms.  He did have some small diverticula noted on his colonoscopy several weeks ago he may have had a mild flare of diverticulitis.  Given his degree of pain and chronicity of symptoms we will check CT abdomen pelvis to further evaluate for possible hernia or diverticulitis.  Discussed reasons to return to care and seek emergent care.  May need referral to surgery pending on results.  Chronic Problems Addressed Today: Hypertension Well controlled on lisinopril 10mg  daily. May be able to stop in the near future if he continues with weight loss.   Obesity Down 25lbs since last time. Congratulated patient on weight loss. Continue lifestyle modifications.   Depression, major, single episode, moderate (HCC) Doing well Celexa 40 mg daily.  Feel like his mood is improved significantly.     Subjective:  HPI:  See A/P for status of chronic conditions  Patient has had intermittent left lower quadrant abdominal pain for the past few months.  No obvious injuries or precipitating events.  It was very severe for the first few weeks and then subsided.  He talked to a Conservation officer, historic buildings as work who concerned for possible pulled muscle told him to continue with watchful waiting.  Symptoms have improved for several weeks but it worsened again over the last several weeks.  Pain is worse with sneezing.  Worse with laughing.  Worse during intercourse.  He has had intermittent constipation and diarrhea.  No nausea or vomiting.  No specific treatments tried.       Objective:  Physical Exam: BP 105/66   Pulse (!) 58   Temp 98.1 F (36.7 C) (Temporal)   Ht 5\' 8"  (1.727 m)   Wt 232 lb (105.2 kg)   SpO2 96%   BMI  35.28 kg/m   Wt Readings from Last 3 Encounters:  11/30/20 232 lb (105.2 kg)  10/19/20 243 lb (110.2 kg)  10/05/20 243 lb 4.8 oz (110.4 kg)    Gen: No acute distress, resting comfortably CV: Regular rate and rhythm with no murmurs appreciated Pulm: Normal work of breathing, clear to auscultation bilaterally with no crackles, wheezes, or rhonchi GI: Soft, nondistended.  Tender to palpation in left lower quadrant without rebound or guarding. GU: Normal male genitalia.  No bulge noted in left inguinal crease with Valsalva maneuver. Neuro: Grossly normal, moves all extremities Psych: Normal affect and thought content      Alaze Garverick M. Jerline Pain, MD 11/30/2020 11:14 AM

## 2020-11-30 NOTE — Assessment & Plan Note (Signed)
Down 25lbs since last time. Congratulated patient on weight loss. Continue lifestyle modifications.

## 2020-11-30 NOTE — Assessment & Plan Note (Signed)
Well controlled on lisinopril 10mg  daily. May be able to stop in the near future if he continues with weight loss.

## 2020-11-30 NOTE — Patient Instructions (Signed)
It was very nice to see you today!  We will check a CT scan.  This will check for hernia and also make sure that you do not have any inflammation in or around your colon.  I am glad everything else is going well.  Please let us know if your symptoms are worsening.  Take care, Dr Jerline Pain  PLEASE NOTE:  If you had any lab tests please let us know if you have not heard back within a few days. You may see your results on mychart before we have a chance to review them but we will give you a call once they are reviewed by [place]. If we ordered any referrals today, please let us know if you have not heard from their office within the next week.   Please try these tips to maintain a healthy lifestyle:   Eat at least 3 REAL meals and 1-2 snacks per day.  Aim for no more than 5 hours between eating.  If you eat breakfast, please do so within one hour of getting up.    Each meal should contain half fruits/vegetables, one quarter protein, and one quarter carbs (no bigger than a computer mouse)   Cut down on sweet beverages. This includes juice, soda, and sweet tea.     Drink at least 1 glass of water with each meal and aim for at least 8 glasses per day   Exercise at least 150 minutes every week.

## 2020-11-30 NOTE — Assessment & Plan Note (Signed)
Doing well Celexa 40 mg daily.  Feel like his mood is improved significantly.

## 2020-12-04 ENCOUNTER — Ambulatory Visit: Payer: BC Managed Care – PPO | Admitting: Family Medicine

## 2020-12-11 ENCOUNTER — Other Ambulatory Visit: Payer: Self-pay

## 2020-12-11 ENCOUNTER — Ambulatory Visit (INDEPENDENT_AMBULATORY_CARE_PROVIDER_SITE_OTHER)
Admission: RE | Admit: 2020-12-11 | Discharge: 2020-12-11 | Disposition: A | Discharge status: Home / Self Care | Payer: BC Managed Care – PPO | Source: Ambulatory Visit | Attending: Family Medicine | Admitting: Family Medicine

## 2020-12-11 DIAGNOSIS — R109 Unspecified abdominal pain: Secondary | ICD-10-CM

## 2020-12-11 DIAGNOSIS — K5750 Diverticulosis of both small and large intestine without perforation or abscess without bleeding: Secondary | ICD-10-CM | POA: Diagnosis not present

## 2020-12-11 DIAGNOSIS — Z9049 Acquired absence of other specified parts of digestive tract: Secondary | ICD-10-CM | POA: Diagnosis not present

## 2020-12-13 NOTE — Progress Notes (Signed)
Please inform patient of the following:  Did not have any hernia on exam but noted to have some diverticulosis.  This could be the source of his pain.  Would like for him to let us know if his symptoms have not improved and we can try course of antibiotics.

## 2021-01-23 LAB — HEPATIC FUNCTION PANEL
ALT: 27 (ref 10–40)
AST: 17 (ref 14–40)
Alkaline Phosphatase: 92 (ref 25–125)
Bilirubin, Total: 0.7

## 2021-01-23 LAB — COMPREHENSIVE METABOLIC PANEL
Albumin: 4.7 (ref 3.5–5.0)
Calcium: 9.5 (ref 8.7–10.7)
GFR calc Af Amer: 133
GFR calc non Af Amer: 115
Globulin: 2.6

## 2021-01-23 LAB — BASIC METABOLIC PANEL
BUN: 14 (ref 4–21)
CO2: 26 — AB (ref 13–22)
Chloride: 102 (ref 99–108)
Creatinine: 0.7 (ref 0.6–1.3)
Glucose: 87
Potassium: 4.3 (ref 3.4–5.3)
Sodium: 138 (ref 137–147)

## 2021-01-23 LAB — VITAMIN B12: Vitamin B-12: 384

## 2021-01-23 LAB — HEMOGLOBIN A1C: Hemoglobin A1C: 5.5

## 2021-01-23 LAB — LIPID PANEL
Cholesterol: 108 (ref 0–200)
HDL: 47 (ref 35–70)
LDL Cholesterol: 47
Triglycerides: 67 (ref 40–160)

## 2021-02-15 ENCOUNTER — Encounter: Payer: Self-pay | Admitting: Family Medicine

## 2021-02-28 ENCOUNTER — Encounter: Payer: Self-pay | Admitting: Physician Assistant

## 2021-02-28 ENCOUNTER — Telehealth: Payer: BC Managed Care – PPO | Admitting: Physician Assistant

## 2021-02-28 DIAGNOSIS — U071 COVID-19: Secondary | ICD-10-CM | POA: Diagnosis not present

## 2021-02-28 MED ORDER — BENZONATATE 100 MG PO CAPS: 100.0000 mg | ORAL_CAPSULE | Freq: Three times a day (TID) | ORAL | 0 refills | Status: DC | PRN

## 2021-02-28 MED ORDER — ALBUTEROL SULFATE HFA 108 (90 BASE) MCG/ACT IN AERS: 2.0000 | INHALATION_SPRAY | Freq: Four times a day (QID) | RESPIRATORY_TRACT | 0 refills | Status: DC | PRN

## 2021-02-28 MED ORDER — MOLNUPIRAVIR EUA 200MG CAPSULE
4.0000 | ORAL_CAPSULE | Freq: Two times a day (BID) | ORAL | 0 refills | Status: AC
Start: 2021-02-28 — End: 2021-03-05

## 2021-02-28 NOTE — Patient Instructions (Signed)
  Eugene Mitchell, thank you for joining [name], PA-C for today's virtual visit.  While this provider is not your primary care provider (PCP), if your PCP is located in our provider database this encounter information will be shared with them immediately following your visit.  Consent: (Patient) Eugene Mitchell provided verbal consent for this virtual visit at the beginning of the encounter.  Current Medications:  Current Outpatient Medications:    atorvastatin (LIPITOR) 20 MG tablet, Take 1 tablet (20 mg total) by mouth daily at 6 PM., Disp: 30 tablet, Rfl: 0   citalopram (CELEXA) 40 MG tablet, Take 1 tablet (40 mg total) by mouth daily., Disp: 30 tablet, Rfl: 3   econazole nitrate 1 % cream, Apply topically 2 (two) times daily., Disp: , Rfl:    fluorouracil (EFUDEX) 5 % cream, Apply topically 2 (two) times daily., Disp: , Rfl:    lisinopril (PRINIVIL,ZESTRIL) 10 MG tablet, Take 10 mg by mouth daily., Disp: , Rfl:    Triamcinolone Acetonide (TRIAMCINOLONE 0.1 % CREAM : EUCERIN) CREA, Apply 1 application topically 2 (two) times daily., Disp: 1 each, Rfl: 3   Medications ordered in this encounter:  No orders of the defined types were placed in this encounter.   *If you need refills on other medications prior to your next appointment, please contact your pharmacy*  Follow-Up: Call back or seek an in-person evaluation if the symptoms worsen or if the condition fails to improve as anticipated.  Other Instructions Please keep well-hydrated and get plenty of rest. Start a saline nasal rinse to flush out your nasal passages. You can use plain Mucinex to help thin congestion. If you have a humidifier, running in the bedroom at night. I want you to start OTC vitamin D3 1000 units daily, vitamin C 1000 mg daily, and a zinc supplement. Please take prescribed medications as directed.  You have been enrolled in a MyChart symptom monitoring program. Please answer these questions  daily so we can keep track of how you are doing.  You were to quarantine for 5 days from onset of your symptoms.  After day 5, if you have had no fever and you are feeling better, you can end quarantine but need to mask for an additional 5 days. After day 5 if you have a fever or are having significant symptoms, please quarantine for full 10 days.  If you note any worsening of symptoms, any significant shortness of breath or any chest pain, please seek ER evaluation ASAP.  Please do not delay care!   If you have been instructed to have an in-person evaluation today at a local Urgent Care facility, please use the link below. It will take you to a list of all of our available [facility], including address, phone number and hours of operation. Please do not delay care.  [facility]  If you or a family member do not have a primary care provider, use the link below to schedule a visit and establish care. When you choose a [facility] primary care physician or advanced practice provider, you gain a long-term partner in health. Find a Primary Care Provider  Learn more about [facility] in-office and virtual care options: [facility]

## 2021-02-28 NOTE — Progress Notes (Signed)
Virtual Visit Consent   Eugene Mitchell, you are scheduled for a virtual visit with a [facility] provider today.     Just as with appointments in the office, your consent must be obtained to participate.  Your consent will be active for this visit and any virtual visit you may have with one of our providers in the next 365 days.     If you have a MyChart account, a copy of this consent can be sent to you electronically.  All virtual visits are billed to your insurance company just like a traditional visit in the office.    As this is a virtual visit, video technology does not allow for your provider to perform a traditional examination.  This may limit your provider's ability to fully assess your condition.  If your provider identifies any concerns that need to be evaluated in person or the need to arrange testing (such as labs, EKG, etc.), we will make arrangements to do so.     Although advances in technology are sophisticated, we cannot ensure that it will always work on either your end or our end.  If the connection with a video visit is poor, the visit may have to be switched to a telephone visit.  With either a video or telephone visit, we are not always able to ensure that we have a secure connection.     I need to obtain your verbal consent now.   Are you willing to proceed with your visit today?    Eugene Mitchell has provided verbal consent on 02/28/2021 for a virtual visit (video or telephone).   Eugene Mitchell, [place]   Date: 02/28/2021 11:46 AM   Virtual Visit via Video Note   I, Eugene Mitchell, connected with  Eugene Mitchell  ([id number], 1976-07-09) on 02/28/21 at 11:30 AM EDT by a video-enabled telemedicine application and verified that I am speaking with the correct person using two identifiers.  Location: Patient: Virtual Visit Location Patient: Home Provider: Virtual Visit Location Provider: Home Office   I discussed the limitations of evaluation and  management by telemedicine and the availability of in person appointments. The patient expressed understanding and agreed to proceed.    History of Present Illness: Eugene Mitchell is a 45 y.o. who identifies as a male who was assigned male at birth, and is being seen today for COVID-19. Endorses symptoms starting yesterday with some shortness of breath while cycling, followed by sore throat, nasal congestion, headache. This morning sore throat was more severe. Denies any known fever or aches. Noting more fatigue today. Took a COVID test this morning which was positive. Has been exposed from his daughter. Has taken some OTC ibuprofen and tylenol for headache and sore throat.   HPI: HPI  Problems:  Patient Active Problem List   Diagnosis Date Noted   Obesity 11/30/2020   Depression, major, single episode, moderate ([facility]) 08/07/2020   Rash 02/04/2019   Skin cancer 07/07/2018   Hyperlipidemia 10/18/2014   Hypertension 12/30/2012    Allergies:  Allergies  Allergen Reactions   Esomeprazole Swelling   Medications:  Current Outpatient Medications:    albuterol (VENTOLIN HFA) 108 (90 Base) MCG/ACT inhaler, Inhale 2 puffs into the lungs every 6 (six) hours as needed for wheezing or shortness of breath., Disp: 8 g, Rfl: 0   benzonatate (TESSALON) 100 MG capsule, Take 1 capsule (100 mg total) by mouth 3 (three) times daily as needed for cough., Disp: 30 capsule,  Rfl: 0   molnupiravir EUA 200 mg CAPS, Take 4 capsules (800 mg total) by mouth 2 (two) times daily for 5 days., Disp: 40 capsule, Rfl: 0   atorvastatin (LIPITOR) 20 MG tablet, Take 1 tablet (20 mg total) by mouth daily at 6 PM., Disp: 30 tablet, Rfl: 0   citalopram (CELEXA) 40 MG tablet, Take 1 tablet (40 mg total) by mouth daily., Disp: 30 tablet, Rfl: 3   econazole nitrate 1 % cream, Apply topically 2 (two) times daily., Disp: , Rfl:    fluorouracil (EFUDEX) 5 % cream, Apply topically 2 (two) times daily., Disp: , Rfl:    lisinopril  (PRINIVIL,ZESTRIL) 10 MG tablet, Take 10 mg by mouth daily., Disp: , Rfl:    Triamcinolone Acetonide (TRIAMCINOLONE 0.1 % CREAM : EUCERIN) CREA, Apply 1 application topically 2 (two) times daily., Disp: 1 each, Rfl: 3  Observations/Objective: Patient is well-developed, well-nourished in no acute distress.  Resting comfortably at home.  Head is normocephalic, atraumatic.  No labored breathing. Speech is clear and coherent with logical content.  Patient is alert and oriented at baseline.   Assessment and Plan: 1. COVID-19 - molnupiravir EUA 200 mg CAPS; Take 4 capsules (800 mg total) by mouth 2 (two) times daily for 5 days.  Dispense: 40 capsule; Refill: 0 - albuterol (VENTOLIN HFA) 108 (90 Base) MCG/ACT inhaler; Inhale 2 puffs into the lungs every 6 (six) hours as needed for wheezing or shortness of breath.  Dispense: 8 g; Refill: 0 - benzonatate (TESSALON) 100 MG capsule; Take 1 capsule (100 mg total) by mouth 3 (three) times daily as needed for cough.  Dispense: 30 capsule; Refill: 0 Patient with multiple risk factors for complicated course of illness. Discussed risks/benefits of antiviral medications including most common potential ADRs. Patient voiced understanding and would like to proceed with antiviral medication. They are candidate for molnupiravir. Rx sent to pharmacy. Supportive measures, OTC medications and vitamin regimen reviewed. Tessalon per orders. Patient has been enrolled in a MyChart COVID symptom monitoring program. Samule Dry reviewed in detail. Strict ER precautions discussed with patient.    Follow Up Instructions: I discussed the assessment and treatment plan with the patient. The patient was provided an opportunity to ask questions and all were answered. The patient agreed with the plan and demonstrated an understanding of the instructions.  A copy of instructions were sent to the patient via MyChart.  The patient was advised to call back or seek an in-person evaluation  if the symptoms worsen or if the condition fails to improve as anticipated.  Time:  I spent 15 minutes with the patient via telehealth technology discussing the above problems/concerns.    Eugene Rio, PA-C

## 2021-06-06 DIAGNOSIS — D225 Melanocytic nevi of trunk: Secondary | ICD-10-CM | POA: Diagnosis not present

## 2021-06-06 DIAGNOSIS — D1801 Hemangioma of skin and subcutaneous tissue: Secondary | ICD-10-CM | POA: Diagnosis not present

## 2021-06-06 DIAGNOSIS — D487 Neoplasm of uncertain behavior of other specified sites: Secondary | ICD-10-CM | POA: Diagnosis not present

## 2021-06-06 DIAGNOSIS — Z85828 Personal history of other malignant neoplasm of skin: Secondary | ICD-10-CM | POA: Diagnosis not present

## 2021-06-06 DIAGNOSIS — L821 Other seborrheic keratosis: Secondary | ICD-10-CM | POA: Diagnosis not present

## 2021-06-06 DIAGNOSIS — L565 Disseminated superficial actinic porokeratosis (DSAP): Secondary | ICD-10-CM | POA: Diagnosis not present

## 2021-06-12 IMAGING — CT CT ABD-PELV W/O CM
2 of 4 series · 16 of 46 positions shown, 18 images · non-contrast
Comparison: None.

CLINICAL DATA: Left lower quadrant pain, diverticulitis

EXAM:
CT ABDOMEN AND PELVIS WITHOUT CONTRAST
TECHNIQUE: Multidetector CT imaging of the abdomen and pelvis was performed
following the standard protocol without IV contrast. Oral enteric
contrast was administered.

[Series 2: abd/ pelvis · axial · 0.83mm/px · z∈[-505,-85]mm · 13 of 92 slices shown, 15 images]
[im 4/92  soft-tissue]
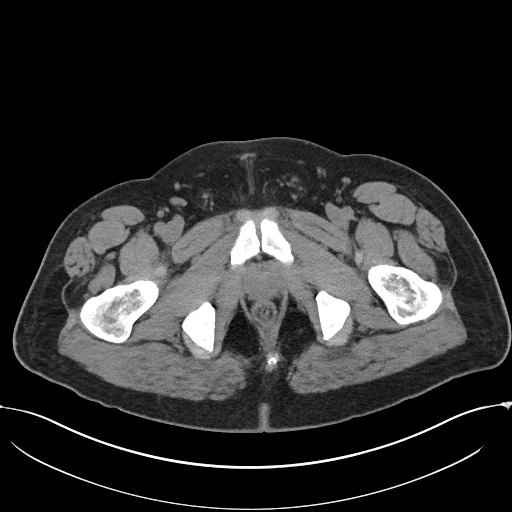
[im 4/92  bone]
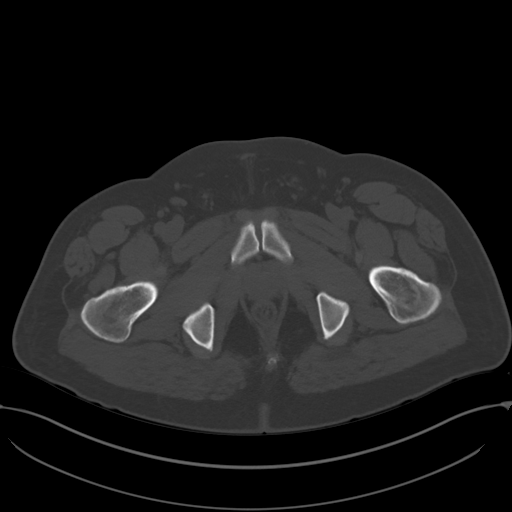
[im 12/92  soft-tissue]
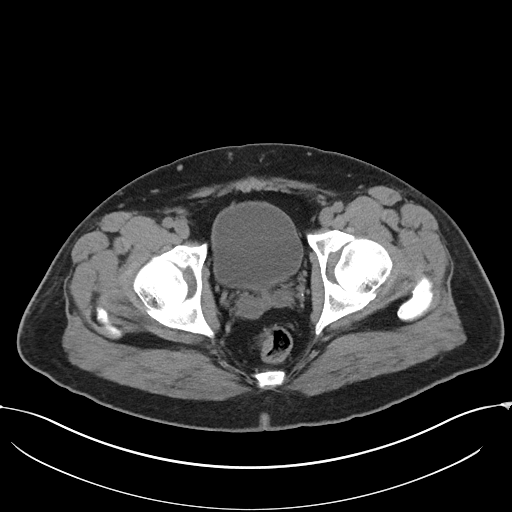
[im 20/92  soft-tissue]
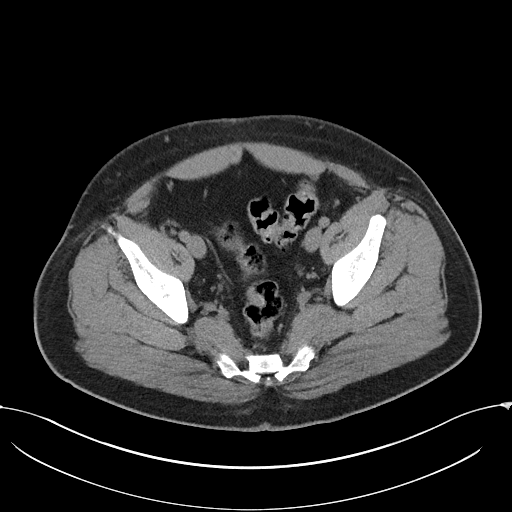
[im 24/92  soft-tissue]
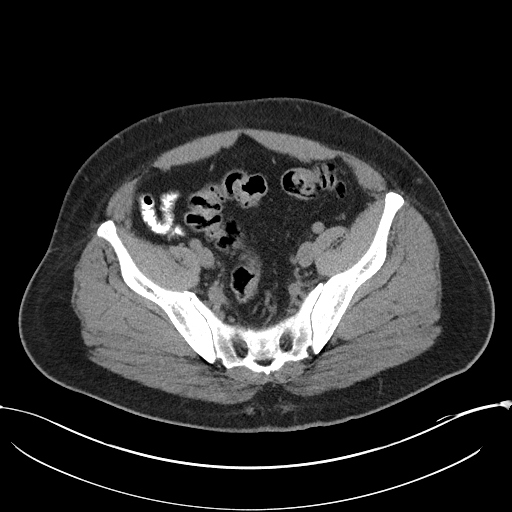
[im 32/92  soft-tissue]
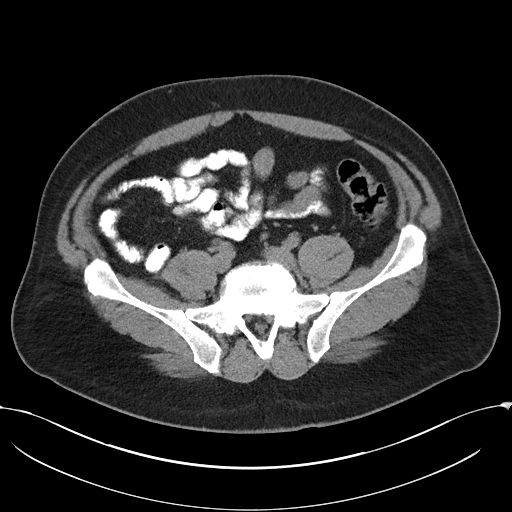
[im 40/92  soft-tissue]
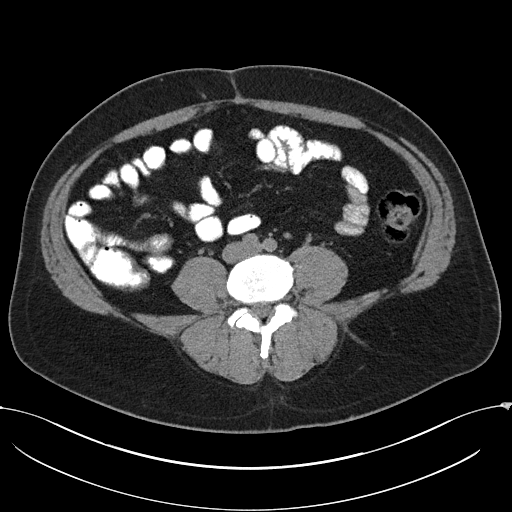
[im 48/92  soft-tissue]
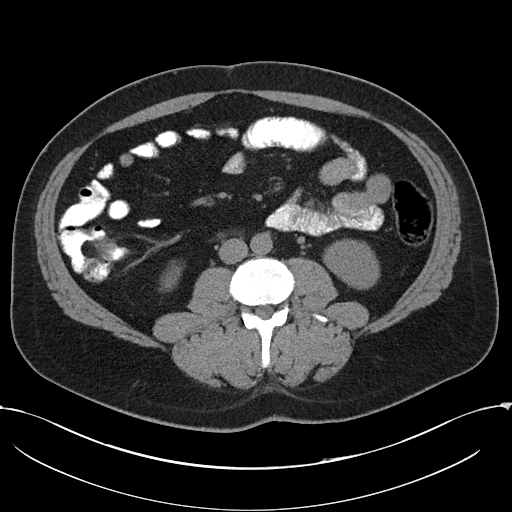
[im 52/92  soft-tissue]
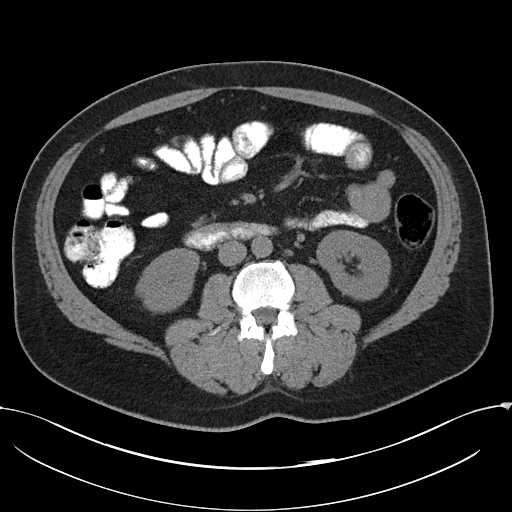
[im 60/92  soft-tissue]
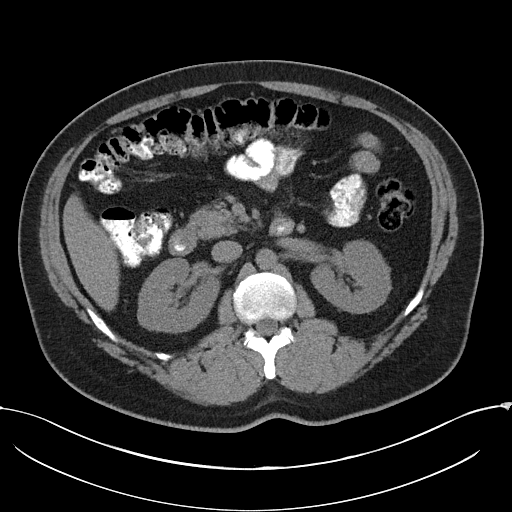
[im 60/92  bone]
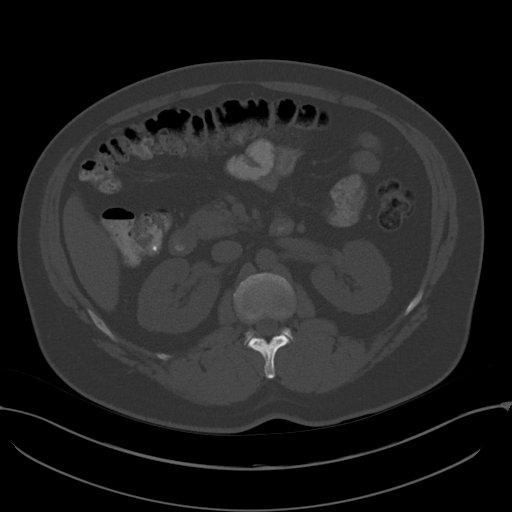
[im 68/92  soft-tissue]
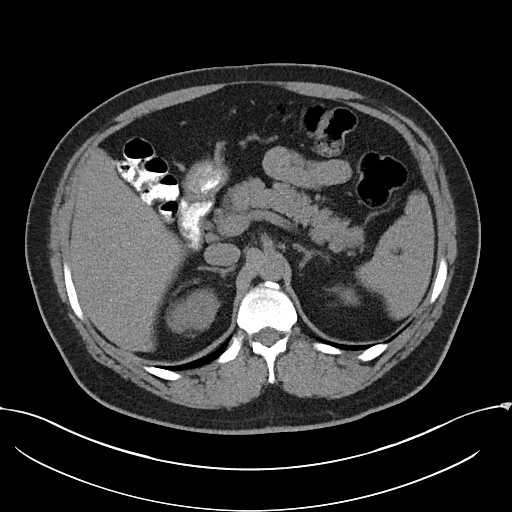
[im 72/92  soft-tissue]
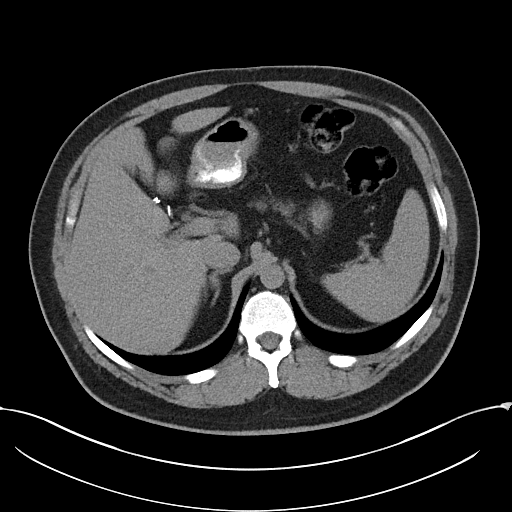
[im 80/92  soft-tissue]
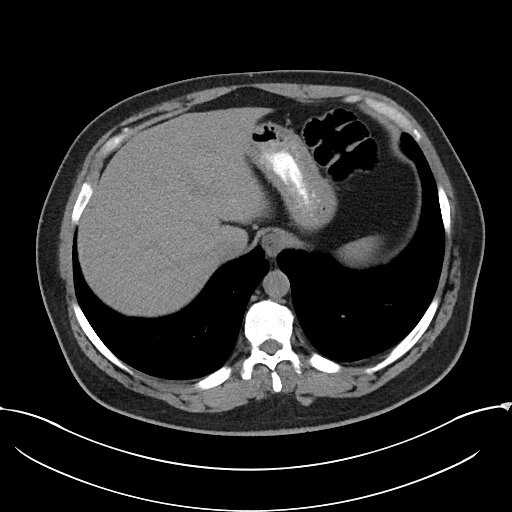
[im 88/92  soft-tissue]
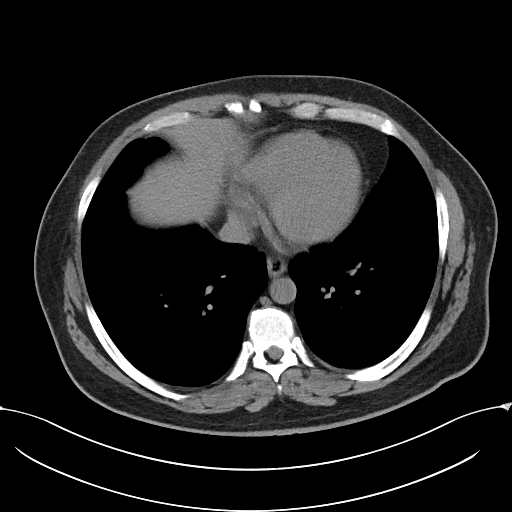

[Series 5: cor st · coronal · 0.75mm/px · 3 of 93 slices shown]
[im 31/93  soft-tissue]
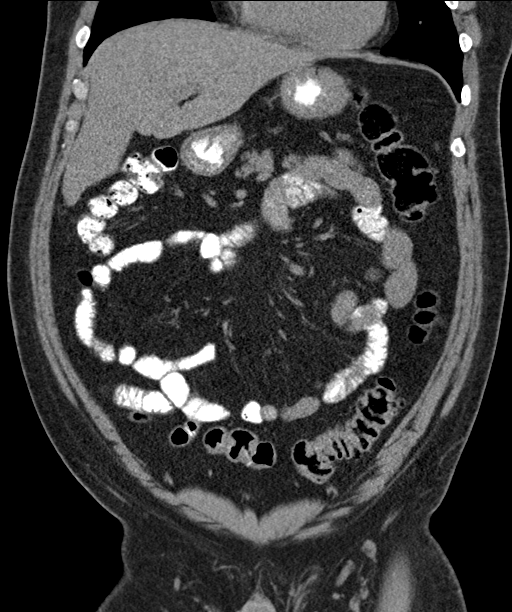
[im 41/93  soft-tissue]
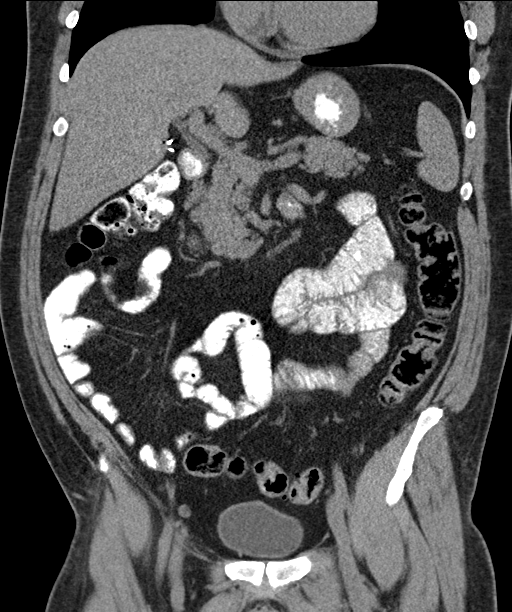
[im 52/93  soft-tissue]
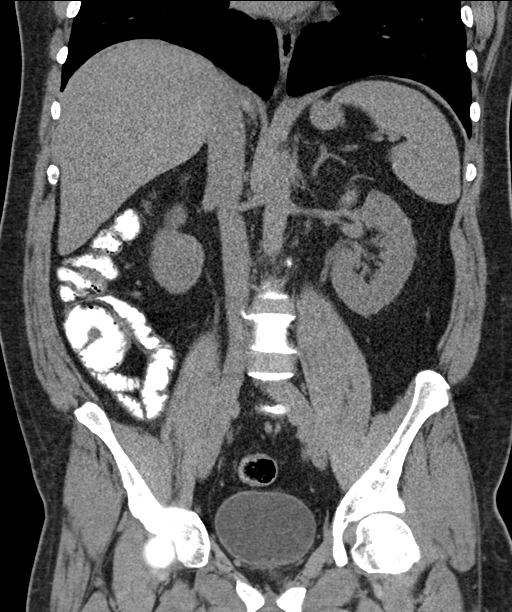

[16 of 46 positions shown; findings below may reference images not displayed]

FINDINGS: Lower chest: No acute abnormality.

Hepatobiliary: No focal liver abnormality is seen. Status post
cholecystectomy. No biliary dilatation.

Pancreas: Unremarkable. No pancreatic ductal dilatation or
surrounding inflammatory changes.

Spleen: Normal in size without significant abnormality.

Adrenals/Urinary Tract: Adrenal glands are unremarkable. Kidneys are
normal, without renal calculi, solid lesion, or hydronephrosis.
Bladder is unremarkable.

Stomach/Bowel: Stomach is within normal limits. Appendix appears
normal. No evidence of bowel wall thickening, distention, or
inflammatory changes. Sigmoid diverticulosis.

Vascular/Lymphatic: No significant vascular findings are present. No
enlarged abdominal or pelvic lymph nodes.

Reproductive: No mass or other significant abnormality.

Other: No abdominal wall hernia or abnormality. No abdominopelvic
ascites.

Musculoskeletal: No acute or significant osseous findings.
IMPRESSION: 1. Sigmoid diverticulosis without evidence of acute diverticulitis.
2. Status post cholecystectomy.

## 2021-06-26 LAB — BASIC METABOLIC PANEL
BUN: 18 (ref 4–21)
CO2: 25 — AB (ref 13–22)
Chloride: 103 (ref 99–108)
Creatinine: 0.8 (ref <=1.3)
Glucose: 89
Potassium: 4.5 (ref 3.4–5.3)
Sodium: 139 (ref 137–147)

## 2021-06-26 LAB — VITAMIN D 25 HYDROXY (VIT D DEFICIENCY, FRACTURES): Vit D, 25-Hydroxy: 25

## 2021-06-26 LAB — CBC AND DIFFERENTIAL
HCT: 48 (ref 41–53)
Hemoglobin: 15.4 (ref 13.5–17.5)
Platelets: 209 (ref 150–399)
WBC: 5.8

## 2021-06-26 LAB — COMPREHENSIVE METABOLIC PANEL
Albumin: 4.7 (ref 3.5–5.0)
Calcium: 9.2 (ref 8.7–10.7)
GFR calc non Af Amer: 109
Globulin: 2.4

## 2021-06-26 LAB — HEPATIC FUNCTION PANEL
Alkaline Phosphatase: 71 (ref 25–125)
Bilirubin, Total: 0.8

## 2021-06-26 LAB — VITAMIN B12: Vitamin B-12: 336

## 2021-06-26 LAB — CBC: RBC: 5.33 — AB (ref 3.87–5.11)

## 2021-06-26 LAB — PSA: PSA: 0.5

## 2021-06-26 LAB — LIPID PANEL
Cholesterol: 135 (ref 0–200)
HDL: 53 (ref 35–70)
LDL Cholesterol: 66
Triglycerides: 78 (ref 40–160)

## 2021-06-26 LAB — TSH: TSH: 1.31 (ref 0.41–5.90)

## 2021-08-14 ENCOUNTER — Encounter: Payer: BC Managed Care – PPO | Admitting: Family Medicine

## 2021-08-28 ENCOUNTER — Ambulatory Visit (INDEPENDENT_AMBULATORY_CARE_PROVIDER_SITE_OTHER): Payer: Managed Care, Other (non HMO) | Admitting: Family Medicine

## 2021-08-28 ENCOUNTER — Encounter: Payer: Self-pay | Admitting: Family Medicine

## 2021-08-28 ENCOUNTER — Other Ambulatory Visit: Payer: Self-pay

## 2021-08-28 VITALS — BP 123/79 | HR 74 | Temp 97.2°F | Ht 68.0 in | Wt 224.6 lb

## 2021-08-28 DIAGNOSIS — F321 Major depressive disorder, single episode, moderate: Secondary | ICD-10-CM

## 2021-08-28 DIAGNOSIS — E785 Hyperlipidemia, unspecified: Secondary | ICD-10-CM

## 2021-08-28 DIAGNOSIS — I10 Essential (primary) hypertension: Secondary | ICD-10-CM

## 2021-08-28 DIAGNOSIS — Z23 Encounter for immunization: Secondary | ICD-10-CM

## 2021-08-28 DIAGNOSIS — Z0001 Encounter for general adult medical examination with abnormal findings: Secondary | ICD-10-CM | POA: Diagnosis not present

## 2021-08-28 DIAGNOSIS — E669 Obesity, unspecified: Secondary | ICD-10-CM

## 2021-08-28 NOTE — Assessment & Plan Note (Signed)
Patient down about 40 pounds over the last year.  Congratulated him on weight loss.

## 2021-08-28 NOTE — Assessment & Plan Note (Signed)
He has lost a lot of weight and is down about 40 pounds over the last year.  We will stop his lisinopril.  Continue home monitoring.  He could not recheck in 6 months.

## 2021-08-28 NOTE — Patient Instructions (Signed)
It was very nice to see you today!  We will stop your cholesterol and blood pressure medication  Please keep up the good work with your diet and exercise.  We will get your flu shot today.  Please come back in 6 months.  Come back sooner if needed.  Take care, Dr Jerline Pain  PLEASE NOTE:  If you had any lab tests please let us know if you have not heard back within a few days. You may see your results on mychart before we have a chance to review them but we will give you a call once they are reviewed by [place]. If we ordered any referrals today, please let us know if you have not heard from their office within the next week.   Please try these tips to maintain a healthy lifestyle:  Eat at least 3 REAL meals and 1-2 snacks per day.  Aim for no more than 5 hours between eating.  If you eat breakfast, please do so within one hour of getting up.   Each meal should contain half fruits/vegetables, one quarter protein, and one quarter carbs (no bigger than a computer mouse)  Cut down on sweet beverages. This includes juice, soda, and sweet tea.   Drink at least 1 glass of water with each meal and aim for at least 8 glasses per day  Exercise at least 150 minutes every week.    Preventive Care 49-32 Years Old, Male Preventive care refers to lifestyle choices and visits with your health care provider that can promote health and wellness. Preventive care visits are also called wellness exams. What can I expect for my preventive care visit? Counseling During your preventive care visit, your health care provider may ask about your: Medical history, including: Past medical problems. Family medical history. Current health, including: Emotional well-being. Home life and relationship well-being. Sexual activity. Lifestyle, including: Alcohol, nicotine or tobacco, and drug use. Access to firearms. Diet, exercise, and sleep habits. Safety issues such as seatbelt and bike helmet use. Sunscreen  use. Work and work Statistician. Physical exam Your health care provider will check your: Height and weight. These may be used to calculate your BMI (body mass index). BMI is a measurement that tells if you are at a healthy weight. Waist circumference. This measures the distance around your waistline. This measurement also tells if you are at a healthy weight and may help predict your risk of certain diseases, such as type 2 diabetes and high blood pressure. Heart rate and blood pressure. Body temperature. Skin for abnormal spots. What immunizations do I need? Vaccines are usually given at various ages, according to a schedule. Your health care provider will recommend vaccines for you based on your age, medical history, and lifestyle or other factors, such as travel or where you work. What tests do I need? Screening Your health care provider may recommend screening tests for certain conditions. This may include: Lipid and cholesterol levels. Diabetes screening. This is done by checking your blood sugar (glucose) after you have not eaten for a while (fasting). Hepatitis B test. Hepatitis C test. HIV (human immunodeficiency virus) test. STI (sexually transmitted infection) testing, if you are at risk. Lung cancer screening. Prostate cancer screening. Colorectal cancer screening. Talk with your health care provider about your test results, treatment options, and if necessary, the need for more tests. Follow these instructions at home: Eating and drinking  Eat a diet that includes fresh fruits and vegetables, whole grains, lean protein, and low-fat dairy  products. Take vitamin and mineral supplements as recommended by your health care provider. Do not drink alcohol if your health care provider tells you not to drink. If you drink alcohol: Limit how much you have to 0-2 drinks a day. Know how much alcohol is in your drink. In the [place] one drink equals one 12 oz bottle of beer (355 mL), one  5 oz glass of wine (148 mL), or one 1 oz glass of hard liquor (44 mL). Lifestyle Brush your teeth every morning and night with fluoride toothpaste. Floss one time each day. Exercise for at least 30 minutes 5 or more days each week. Do not use any products that contain nicotine or tobacco. These products include cigarettes, chewing tobacco, and vaping devices, such as e-cigarettes. If you need help quitting, ask your health care provider. Do not use drugs. If you are sexually active, practice safe sex. Use a condom or other form of protection to prevent STIs. Take aspirin only as told by your health care provider. Make sure that you understand how much to take and what form to take. Work with your health care provider to find out whether it is safe and beneficial for you to take aspirin daily. Find healthy ways to manage stress, such as: Meditation, yoga, or listening to music. Journaling. Talking to a trusted person. Spending time with friends and family. Minimize exposure to UV radiation to reduce your risk of skin cancer. Safety Always wear your seat belt while driving or riding in a vehicle. Do not drive: If you have been drinking alcohol. Do not ride with someone who has been drinking. When you are tired or distracted. While texting. If you have been using any mind-altering substances or drugs. Wear a helmet and other protective equipment during sports activities. If you have firearms in your house, make sure you follow all gun safety procedures. What's next? Go to your health care provider once a year for an annual wellness visit. Ask your health care provider how often you should have your eyes and teeth checked. Stay up to date on all vaccines. This information is not intended to replace advice given to you by your health care provider. Make sure you discuss any questions you have with your health care provider. Document Revised: 01/09/2021 Document Reviewed: 01/09/2021 Elsevier  Patient Education  [place].

## 2021-08-28 NOTE — Assessment & Plan Note (Addendum)
He has done well with lifestyle modifications and weight loss.  We will stop his statin- his numbers were borderline before starting statin.  Likely is not getting much benefit at this point.  He will come back in 6 months to recheck lipids.

## 2021-08-28 NOTE — Assessment & Plan Note (Signed)
Doing well on Celexa 40 mg daily.

## 2021-08-28 NOTE — Progress Notes (Signed)
Chief Complaint:  Eugene Mitchell is a 46 y.o. male who presents today for his annual comprehensive physical exam.    Assessment/Plan:  Chronic Problems Addressed Today: Hyperlipidemia He has done well with lifestyle modifications and weight loss.  We will stop his statin- his numbers were borderline before starting statin.  Likely is not getting much benefit at this point.  He will come back in 6 months to recheck lipids.   Hypertension He has lost a lot of weight and is down about 40 pounds over the last year.  We will stop his lisinopril.  Continue home monitoring.  He could not recheck in 6 months.  Obesity Patient down about 40 pounds over the last year.  Congratulated him on weight loss.  Depression, major, single episode, moderate (HCC) Doing well on Celexa 40 mg daily.  Body mass index is 34.15 kg/m. / Obese  BMI Metric Follow Up - 08/28/21 1049       BMI Metric Follow Up-Please document annually   BMI Metric Follow Up Education provided              Preventative Healthcare: Flu shot given today.  Had labs done at work.  No failure on colon cancer screening.  Patient Counseling(The following topics were reviewed and/or handout was given):  -Nutrition: Stressed importance of moderation in sodium/caffeine intake, saturated fat and cholesterol, caloric balance, sufficient intake of fresh fruits, vegetables, and fiber.  -Stressed the importance of regular exercise.   -Substance Abuse: Discussed cessation/primary prevention of tobacco, alcohol, or other drug use; driving or other dangerous activities under the influence; availability of treatment for abuse.   -Injury prevention: Discussed safety belts, safety helmets, smoke detector, smoking near bedding or upholstery.   -Sexuality: Discussed sexually transmitted diseases, partner selection, use of condoms, avoidance of unintended pregnancy and contraceptive alternatives.   -Dental health: Discussed importance of  regular tooth brushing, flossing, and dental visits.  -Health maintenance and immunizations reviewed. Please refer to Health maintenance section.  Return to care in 1 year for next preventative visit.     Subjective:  HPI:  He has no acute complaints today. See A/p for status of chronic conditions.   Lifestyle Diet: Balanced. Plenty of fruits and vegetables.  Exercise: Rides bicycle routinely.   Depression screen PHQ 2/9 08/28/2021  Decreased Interest 0  Down, Depressed, Hopeless 0  PHQ - 2 Score 0  Altered sleeping -  Tired, decreased energy -  Change in appetite -  Feeling bad or failure about yourself  -  Trouble concentrating -  Moving slowly or fidgety/restless -  Suicidal thoughts -  PHQ-9 Score -  Difficult doing work/chores -    Health Maintenance Due  Topic Date Due   HIV Screening  Never done   Hepatitis C Screening  Never done   COVID-19 Vaccine (4 - Booster for Pfizer series) 08/12/2020   INFLUENZA VACCINE  02/25/2021     ROS: Per HPI, otherwise a complete review of systems was negative.   PMH:  The following were reviewed and entered/updated in epic: Past Medical History:  Diagnosis Date   Depression    Hypercholesteremia    Hypertension    TIA (transient ischemic attack) 2016   Patient Active Problem List   Diagnosis Date Noted   Obesity 11/30/2020   Depression, major, single episode, moderate ([place]) 08/07/2020   Rash 02/04/2019   Skin cancer 07/07/2018   Hyperlipidemia 10/18/2014   Hypertension 12/30/2012   Past Surgical History:  Procedure  Laterality Date   CHOLECYSTECTOMY     ELBOW SURGERY Left     Family History  Problem Relation Age of Onset   Thyroid disease Mother    Cancer Mother        Lung   Stroke Father    Coronary artery disease Father    Cancer Father        Lung   Colon cancer Neg Hx    Esophageal cancer Neg Hx    Rectal cancer Neg Hx    Stomach cancer Neg Hx     Medications- reviewed and updated Current  Outpatient Medications  Medication Sig Dispense Refill   citalopram (CELEXA) 40 MG tablet Take 1 tablet (40 mg total) by mouth daily. 30 tablet 3   econazole nitrate 1 % cream Apply topically 2 (two) times daily.     fluorouracil (EFUDEX) 5 % cream Apply topically 2 (two) times daily.     Triamcinolone Acetonide (TRIAMCINOLONE 0.1 % CREAM : EUCERIN) CREA Apply 1 application topically 2 (two) times daily. 1 each 3   No current facility-administered medications for this visit.    Allergies-reviewed and updated Allergies  Allergen Reactions   Esomeprazole Swelling    Social History   Socioeconomic History   Marital status: Married    Spouse name: Not on file   Number of children: 2   Years of education: 12   Highest education level: Not on file  Occupational History   Occupation: Physiological scientist  Tobacco Use   Smoking status: Former    Types: Cigarettes    Quit date: 10/12/2011    Years since quitting: 9.8   Smokeless tobacco: Never  Vaping Use   Vaping Use: Never used  Substance and Sexual Activity   Alcohol use: Yes    Alcohol/week: 0.0 standard drinks    Comment: occ   Drug use: No   Sexual activity: Yes    Partners: Male  Other Topics Concern   Not on file  Social History Narrative   Lives at home with his parents.   Left-handed.   3-6 cups caffeine daily.   Social Determinants of Health   Financial Resource Strain: Not on file  Food Insecurity: Not on file  Transportation Needs: Not on file  Physical Activity: Not on file  Stress: Not on file  Social Connections: Not on file        Objective:  Physical Exam: BP 123/79 (BP Location: Right Arm)    Pulse 74    Temp (!) 97.2 F (36.2 C) (Temporal)    Ht 5\' 8"  (1.727 m)    Wt 224 lb 9.6 oz (101.9 kg)    SpO2 94%    BMI 34.15 kg/m   Body mass index is 34.15 kg/m. Wt Readings from Last 3 Encounters:  08/28/21 224 lb 9.6 oz (101.9 kg)  11/30/20 232 lb (105.2 kg)  10/19/20 243 lb (110.2 kg)   Gen: NAD,  resting comfortably HEENT: TMs normal bilaterally. OP clear. No thyromegaly noted.  CV: RRR with no murmurs appreciated Pulm: NWOB, CTAB with no crackles, wheezes, or rhonchi GI: Normal bowel sounds present. Soft, Nontender, Nondistended. MSK: no edema, cyanosis, or clubbing noted Skin: warm, dry Neuro: CN2-12 grossly intact. Strength 5/5 in upper and lower extremities. Reflexes symmetric and intact bilaterally.  Psych: Normal affect and thought content     Gail Vendetti M. Jerline Pain, MD 08/28/2021 10:49 AM

## 2021-09-02 ENCOUNTER — Encounter: Payer: Self-pay | Admitting: Family Medicine

## 2021-10-27 ENCOUNTER — Telehealth: Payer: Managed Care, Other (non HMO) | Admitting: Family

## 2021-10-27 DIAGNOSIS — J019 Acute sinusitis, unspecified: Secondary | ICD-10-CM

## 2021-10-27 MED ORDER — AMOXICILLIN-POT CLAVULANATE 875-125 MG PO TABS: 1.0000 | ORAL_TABLET | Freq: Two times a day (BID) | ORAL | 0 refills | Status: DC

## 2021-10-27 NOTE — Patient Instructions (Signed)

## 2021-10-27 NOTE — Progress Notes (Signed)
?Virtual Visit Consent  ? ?Eugene Mitchell, you are scheduled for a virtual visit with a [facility] provider today.   ?  ?Just as with appointments in the office, your consent must be obtained to participate.  Your consent will be active for this visit and any virtual visit you may have with one of our providers in the next 365 days.   ?  ?If you have a MyChart account, a copy of this consent can be sent to you electronically.  All virtual visits are billed to your insurance company just like a traditional visit in the office.   ? ?As this is a virtual visit, video technology does not allow for your provider to perform a traditional examination.  This may limit your provider's ability to fully assess your condition.  If your provider identifies any concerns that need to be evaluated in person or the need to arrange testing (such as labs, EKG, etc.), we will make arrangements to do so.   ?  ?Although advances in technology are sophisticated, we cannot ensure that it will always work on either your end or our end.  If the connection with a video visit is poor, the visit may have to be switched to a telephone visit.  With either a video or telephone visit, we are not always able to ensure that we have a secure connection.    ? ?I need to obtain your verbal consent now.   Are you willing to proceed with your visit today?  ?  ?Eugene Mitchell has provided verbal consent on 10/27/2021 for a virtual visit (video or telephone). ?  ?Eugene Dun, FNP  ? ?Date: 10/27/2021 9:08 AM ? ? ?Virtual Visit via Video Note  ? ?IEvelina Mitchell, connected with  Eugene Mitchell  ([id number], [birth year]) on 10/27/21 at  9:15 AM EDT by a video-enabled telemedicine application and verified that I am speaking with the correct person using two identifiers. ? ?Location: ?Patient: Virtual Visit Location Patient: Home ?Provider: Virtual Visit Location Provider: Home Office ?  ?I discussed the limitations of evaluation and management by  telemedicine and the availability of in person appointments. The patient expressed understanding and agreed to proceed.   ? ?History of Present Illness: ?Eugene Mitchell is a 46 y.o. who identifies as a male who was assigned male at birth, and is being seen today for sinus for last two weeks. He tested negative for COVID at home. ? ?HPI: Sinusitis ?This is a new problem. The current episode started 1 to 4 weeks ago. The problem has been gradually worsening since onset. There has been no fever. His pain is at a severity of 7/10. Associated symptoms include congestion, coughing, headaches, sinus pressure and sneezing. Pertinent negatives include no chills, ear pain or shortness of breath. Past treatments include acetaminophen and oral decongestants. The treatment provided mild relief.   ?Problems:  ?Patient Active Problem List  ? Diagnosis Date Noted  ? Obesity 11/30/2020  ? Depression, major, single episode, moderate ([place]) 08/07/2020  ? Rash 02/04/2019  ? Skin cancer 07/07/2018  ? Hyperlipidemia 10/18/2014  ? Hypertension 12/30/2012  ?  ?Allergies:  ?Allergies  ?Allergen Reactions  ? Esomeprazole Swelling  ? ?Medications:  ?Current Outpatient Medications:  ?  amoxicillin-clavulanate (AUGMENTIN) 875-125 MG tablet, Take 1 tablet by mouth 2 (two) times daily., Disp: 14 tablet, Rfl: 0 ?  citalopram (CELEXA) 40 MG tablet, Take 1 tablet (40 mg total) by mouth daily., Disp: 30 tablet, Rfl: 3 ?  econazole nitrate 1 % cream, Apply topically 2 (two) times daily., Disp: , Rfl:  ?  fluorouracil (EFUDEX) 5 % cream, Apply topically 2 (two) times daily., Disp: , Rfl:  ?  Triamcinolone Acetonide (TRIAMCINOLONE 0.1 % CREAM : EUCERIN) CREA, Apply 1 application topically 2 (two) times daily., Disp: 1 each, Rfl: 3 ? ?Observations/Objective: ?Patient is well-developed, well-nourished in no acute distress.  ?Resting comfortably  at home.  ?Head is normocephalic, atraumatic.  ?No labored breathing.  ?Speech is clear and coherent with  logical content.  ?Patient is alert and oriented at baseline.  ?Nasal congestion  ? ?Assessment and Plan: ?1. Acute sinusitis, recurrence not specified, unspecified location ?- amoxicillin-clavulanate (AUGMENTIN) 875-125 MG tablet; Take 1 tablet by mouth 2 (two) times daily.  Dispense: 14 tablet; Refill: 0 ? ?- Take meds as prescribed ?- Use a cool mist humidifier  ?-Use saline nose sprays frequently ?-Force fluids ?-For any cough or congestion ? Use plain Mucinex- regular strength or max strength is fine ?-For fever or aces or pains- take tylenol or ibuprofen. ?-Throat lozenges if help ?-New toothbrush in 3 days ? ? ?Follow Up Instructions: ?I discussed the assessment and treatment plan with the patient. The patient was provided an opportunity to ask questions and all were answered. The patient agreed with the plan and demonstrated an understanding of the instructions.  A copy of instructions were sent to the patient via MyChart unless otherwise noted below.  ? ? ? ?The patient was advised to call back or seek an in-person evaluation if the symptoms worsen or if the condition fails to improve as anticipated. ? ?Time:  ?I spent 6 minutes with the patient via telehealth technology discussing the above problems/concerns.   ? ?Eugene Dun, FNP ? ? ?

## 2021-10-30 ENCOUNTER — Other Ambulatory Visit: Payer: Self-pay | Admitting: Family Medicine

## 2021-10-31 MED ORDER — ECONAZOLE NITRATE 1 % EX CREA: TOPICAL_CREAM | Freq: Two times a day (BID) | CUTANEOUS | 1 refills | Status: DC

## 2022-02-26 ENCOUNTER — Encounter: Payer: Self-pay | Admitting: Family Medicine

## 2022-02-26 ENCOUNTER — Ambulatory Visit: Payer: Managed Care, Other (non HMO) | Admitting: Family Medicine

## 2022-02-26 VITALS — BP 115/75 | HR 66 | Temp 97.9°F | Ht 68.0 in | Wt 211.4 lb

## 2022-02-26 DIAGNOSIS — E669 Obesity, unspecified: Secondary | ICD-10-CM

## 2022-02-26 DIAGNOSIS — R739 Hyperglycemia, unspecified: Secondary | ICD-10-CM | POA: Diagnosis not present

## 2022-02-26 DIAGNOSIS — E785 Hyperlipidemia, unspecified: Secondary | ICD-10-CM

## 2022-02-26 DIAGNOSIS — F321 Major depressive disorder, single episode, moderate: Secondary | ICD-10-CM

## 2022-02-26 DIAGNOSIS — I10 Essential (primary) hypertension: Secondary | ICD-10-CM

## 2022-02-26 LAB — CBC
HCT: 46.5 % (ref 39.0–52.0)
Hemoglobin: 15.5 g/dL (ref 13.0–17.0)
MCHC: 33.4 g/dL (ref 30.0–36.0)
MCV: 87.1 fl (ref 78.0–100.0)
Platelets: 218 10*3/uL (ref 150.0–400.0)
RBC: 5.34 Mil/uL (ref 4.22–5.81)
RDW: 13.3 % (ref 11.5–15.5)
WBC: 5.4 10*3/uL (ref 4.0–10.5)

## 2022-02-26 LAB — COMPREHENSIVE METABOLIC PANEL
ALT: 22 U/L (ref 0–53)
AST: 14 U/L (ref 0–37)
Albumin: 4.7 g/dL (ref 3.5–5.2)
Alkaline Phosphatase: 64 U/L (ref 39–117)
BUN: 17 mg/dL (ref 6–23)
CO2: 27 mEq/L (ref 19–32)
Calcium: 9.3 mg/dL (ref 8.4–10.5)
Chloride: 103 mEq/L (ref 96–112)
Creatinine, Ser: 0.73 mg/dL (ref 0.40–1.50)
GFR: 108.32 mL/min (ref >60.00)
Glucose, Bld: 87 mg/dL (ref 70–99)
Potassium: 4.5 mEq/L (ref 3.5–5.1)
Sodium: 140 mEq/L (ref 135–145)
Total Bilirubin: 0.7 mg/dL (ref 0.2–1.2)
Total Protein: 7.3 g/dL (ref 6.0–8.3)

## 2022-02-26 LAB — LIPID PANEL
Cholesterol: 204 mg/dL — ABNORMAL HIGH (ref 0–200)
HDL: 50.4 mg/dL (ref >39.00)
LDL Cholesterol: 130 mg/dL — ABNORMAL HIGH (ref 0–99)
NonHDL: 153.93
Total CHOL/HDL Ratio: 4
Triglycerides: 121 mg/dL (ref 0.0–149.0)
VLDL: 24.2 mg/dL (ref 0.0–40.0)

## 2022-02-26 LAB — TSH: TSH: 0.84 u[IU]/mL (ref 0.35–5.50)

## 2022-02-26 LAB — HEMOGLOBIN A1C: Hgb A1c MFr Bld: 5.9 % (ref 4.6–6.5)

## 2022-02-26 NOTE — Assessment & Plan Note (Signed)
Blood pressure at goal today off medications. 

## 2022-02-26 NOTE — Assessment & Plan Note (Signed)
Down 13lb since our last visit. He is working hard on diet and exercise. Congratulated patient on weight loss.

## 2022-02-26 NOTE — Assessment & Plan Note (Addendum)
HE stopped taking celexa several months ago. His mood has been stable. He feels like he has a seasonal component to his mood and it typically reaches a nadir in January and February. He would like to stay off medications for now. We will leave celexa on his medication list for now as he thinks he will probably need to restart this winter. Advised patient to contact me if he notices his mood starts to decrease and he can restart his Celexa.  Recommend starting back at 20 mg daily for 1 to 2 weeks and then increasing back to 40 mg daily to minimize side effects.

## 2022-02-26 NOTE — Assessment & Plan Note (Signed)
He stopped statin about 6 months ago after having significant improvement in weight with diet and exercise. We will recheck lipids today.

## 2022-02-26 NOTE — Progress Notes (Signed)
   Eugene Mitchell is a 46 y.o. male who presents today for an office visit.  Assessment/Plan:  Chronic Problems Addressed Today: Hypertension Blood pressure at goal today off medications.   Obesity Down 13lb since our last visit. He is working hard on diet and exercise. Congratulated patient on weight loss.   Hyperlipidemia He stopped statin about 6 months ago after having significant improvement in weight with diet and exercise. We will recheck lipids today.   Depression, major, single episode, moderate ([place]) HE stopped taking celexa several months ago. His mood has been stable. He feels like he has a seasonal component to his mood and it typically reaches a nadir in January and February. He would like to stay off medications for now. We will leave celexa on his medication list for now as he thinks he will probably need to restart this winter. Advised patient to contact me if he notices his mood starts to decrease and he can restart his Celexa.  Recommend starting back at 20 mg daily for 1 to 2 weeks and then increasing back to 40 mg daily to minimize side effects.     Subjective:  HPI:  See A/p for status of chronic conditions.         Objective:  Physical Exam: BP 115/75   Pulse 66   Temp 97.9 F (36.6 C) (Temporal)   Ht '5\' 8"'$  (1.727 m)   Wt 211 lb 6.4 oz (95.9 kg)   SpO2 97%   BMI 32.14 kg/m   Wt Readings from Last 3 Encounters:  02/26/22 211 lb 6.4 oz (95.9 kg)  08/28/21 224 lb 9.6 oz (101.9 kg)  11/30/20 232 lb (105.2 kg)  Gen: No acute distress, resting comfortably CV: Regular rate and rhythm with no murmurs appreciated Pulm: Normal work of breathing, clear to auscultation bilaterally with no crackles, wheezes, or rhonchi Neuro: Grossly normal, moves all extremities Psych: Normal affect and thought content      Elysabeth Aust M. Jerline Pain, MD 02/26/2022 9:41 AM

## 2022-02-26 NOTE — Patient Instructions (Addendum)
It was very nice to see you today!  Keep up the good work!  We will check blood work today.   I will see back in 6 months for your annual physical.  Please come back to see me sooner if needed.  Take care, Dr Jerline Pain  PLEASE NOTE:  If you had any lab tests please let us know if you have not heard back within a few days. You may see your results on mychart before we have a chance to review them but we will give you a call once they are reviewed by [place]. If we ordered any referrals today, please let us know if you have not heard from their office within the next week.   Please try these tips to maintain a healthy lifestyle:  Eat at least 3 REAL meals and 1-2 snacks per day.  Aim for no more than 5 hours between eating.  If you eat breakfast, please do so within one hour of getting up.   Each meal should contain half fruits/vegetables, one quarter protein, and one quarter carbs (no bigger than a computer mouse)  Cut down on sweet beverages. This includes juice, soda, and sweet tea.   Drink at least 1 glass of water with each meal and aim for at least 8 glasses per day  Exercise at least 150 minutes every week.

## 2022-02-28 NOTE — Progress Notes (Signed)
Please inform patient of the following:  His cholesterol is up a bit since stopping his cholesterol medication however it is currently except where it is.  We do not need to start any medications.  He should continue to work on diet and exercise and we can recheck again in 6 to 12 months.  Everything else is stable and we can recheck at his next office visit.

## 2022-05-16 ENCOUNTER — Telehealth: Payer: Self-pay | Admitting: Family Medicine

## 2022-05-16 NOTE — Telephone Encounter (Signed)
Patient Name: ZYKEE AVAKIAN Gender: Male DOB: 06-05-1976 Age: 46 Y 77 M 4 D Return Phone Number: [phone] (Primary) Address: City/ State/ Zip: [place]  [address] Client [facility] Provider Dimas Chyle- MD Contact Type Call Who Is Calling Patient / Member / Family / Caregiver Call Type Triage / Clinical Relationship To Patient Self Return Phone Number [phone] (Primary) Chief Complaint HEAD INJURY - and not acting right. Change in behaviour after hitting head. Reason for Call Symptomatic / Request for Health Information Initial Comment Caller states he head his head on Monday and has been having headaches and pressure in his ears. Caller states he feels a knot. Translation No Nurse Assessment Nurse: Zenia Resides, RN, Diane Date/Time Eilene Ghazi Time): 05/16/2022 10:30:51 AM Confirm and document reason for call. If symptomatic, describe symptoms. ---Caller states he hit his head on Monday and he has been having a headache and a pressure that comes and goes. Top of his head still hurts and there is a lump. Not affecting balance. Yesterday morning, he had a lapse of memory for a minute or two. Pt. is having some light sensitivity. Does the patient have any new or worsening symptoms? ---Yes Will a triage be completed? ---Yes Related visit to physician within the last 2 weeks? ---No Does the PT have any chronic conditions? (i.e. diabetes, asthma, this includes High risk factors for pregnancy, etc.) ---No Is this a behavioral health or substance abuse call? ---No Guidelines Guideline Title Affirmed Question Affirmed Notes Nurse Date/Time (Eastern Time) Head Injury [1] After 72 hours AND [2] headache persists Zenia Resides, Therapist, sports, Diane 05/16/2022 10:34:54 AM PLEASE NOTE: All timestamps contained within this report are represented as [place] Standard Time. CONFIDENTIALTY NOTICE:  This fax transmission is intended only for the addressee. It contains information that is legally privileged, confidential or otherwise protected from use or disclosure. If you are not the intended recipient, you are strictly prohibited from reviewing, disclosing, copying using or disseminating any of this information or taking any action in reliance on or regarding this information. If you have received this fax in error, please notify us immediately by telephone so that we can arrange for its return to [place]. Phone: [phone], Toll-Free: [phone], Fax: [phone] Page: 2 of 2 Call Id: [id number] [place]. Time Eilene Ghazi Time) Disposition Final User 05/16/2022 10:29:11 AM Send to Urgent Darolyn Rua 05/16/2022 10:36:56 AM SEE PCP WITHIN 3 DAYS Yes Zenia Resides, RN, Diane Final Disposition 05/16/2022 10:36:56 AM SEE PCP WITHIN 3 DAYS Yes Zenia Resides, RN, Diane Caller Disagree/Comply Comply Caller Understands Yes PreDisposition Call Doctor Care Advice Given Per Guideline SEE PCP WITHIN 3 DAYS: * You need to be seen within 2 or 3 days. * ACETAMINOPHEN - EXTRA STRENGTH TYLENOL: Take 1,000 mg (two 500 mg pills) every 6 to 8 hours as needed. Each Extra Strength Tylenol pill has 500 mg of acetaminophen. The most you should take is 6 pills a day (3,000 mg total). Note: In [place], the maximum is 8 pills a day (4,000 mg total). PAIN MEDICINES: CALL BACK IF: * You become worse CARE ADVICE given per Head Injury (Adult) guideline. Referrals REFERRED TO PCP OFFICE
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
## 2022-05-19 ENCOUNTER — Ambulatory Visit: Payer: Managed Care, Other (non HMO) | Admitting: Family Medicine

## 2022-05-19 ENCOUNTER — Encounter: Payer: Self-pay | Admitting: Family Medicine

## 2022-05-19 VITALS — BP 140/80 | HR 62 | Temp 98.5°F | Ht 68.0 in | Wt 221.0 lb

## 2022-05-19 DIAGNOSIS — G44309 Post-traumatic headache, unspecified, not intractable: Secondary | ICD-10-CM | POA: Diagnosis not present

## 2022-05-19 DIAGNOSIS — Z8679 Personal history of other diseases of the circulatory system: Secondary | ICD-10-CM

## 2022-05-19 DIAGNOSIS — S060X0A Concussion without loss of consciousness, initial encounter: Secondary | ICD-10-CM | POA: Diagnosis not present

## 2022-05-19 NOTE — Progress Notes (Signed)
Subjective  CC:  Chief Complaint  Patient presents with   Headache    Pt stated that he has been having some headaches since his head injury on 05/12/2022. He stated that he could not see where the beam was under his deck and hit his head   Same day acute visit; PCP not available. New pt to me. Chart reviewed.   HPI: Eugene Mitchell is a 46 y.o. male who presents to the office today to address the problems listed above in the chief complaint. Very pleasant and overall healthy 46 yo male with headaches. Reports was working on building his deck and abruptly stood up and hit the top of his head hard on a wooden beam 05/12/22. He was a bit dazed but did not lose consciousness. Was able to continue working. Since, he has had frontal and top of the head headaches described as throbbing pain relieved by tylenol, however only taking tylenol about once a day. He has not had any confusion, AMS, vision changes, paresis, confusion or change in energy level. 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 However, went camping this weekend and 4 wheeling and did well. No sleep or memory disturbances. Remote h/o concussion as a young boy.  H/o hypertension: bp was elevated by reading this am; he was worried about this. No cp.   Assessment  1. Concussion without loss of consciousness, initial encounter   2. Post-concussion headache   3. History of hypertension      Plan  Concussion w/ postconcussive headaches:  educated. No sxs that warrant brain imaging at this time. Recommend rest, good hydration and nutrition and treat headaches w/ tylenol or advil. To monitor for progressive or persistent sxs. Red flags discussed. Will keep out of work today. See AVS. He will follow up if sxs do not resolve or if they worsen in any way.  Elevated BP: suspect pain and or stress response. Will treat headaches and then recheck.   Follow up: as needed  Visit  date not found  No orders of the defined types were placed in this encounter.  No orders of the defined types were placed in this encounter.     I reviewed the patients updated PMH, FH, and SocHx.    Patient Active Problem List   Diagnosis Date Noted   Obesity 11/30/2020   Depression, major, single episode, moderate ([place]) 08/07/2020   Rash 02/04/2019   Skin cancer 07/07/2018   Hyperlipidemia 10/18/2014   Hypertension 12/30/2012   Current Meds  Medication Sig   Triamcinolone Acetonide (TRIAMCINOLONE 0.1 % CREAM : EUCERIN) CREA Apply 1 application topically 2 (two) times daily.    Allergies: Patient is allergic to esomeprazole. Family History: Patient family history includes Cancer in his father and mother; Coronary artery disease in his father; Stroke in his father; Thyroid disease in his mother. Social History:  Patient  reports that he quit smoking about 10 years ago. His smoking use included cigarettes. He has never used smokeless tobacco. He reports current alcohol use. He reports that he does not use drugs.  Review of Systems: Constitutional: Negative for fever malaise or anorexia Cardiovascular: negative for chest pain Respiratory: negative for SOB or persistent cough Gastrointestinal: negative for abdominal pain  Objective  Vitals: BP (!) 140/80   Pulse 62   Temp 98.5 F (36.9 C)   Ht '5\' 8"'$  (1.727 m)   Wt 221 lb (100.2 kg)  SpO2 96%   BMI 33.60 kg/m  General: no acute distress , A&Ox3 Psych: nl affect nl speech HEENT: PEERL, minimal photophobia, nl EOMI, conjunctiva normal, neck is supple, TM nl Bilaterally Cardiovascular:  RRR without murmur or gallop.  Respiratory:  Good breath sounds bilaterally, CTAB with normal respiratory effort Skin:  Warm, no rashes Neuro: cr n 2-12 intact. Nl gait. No dysarthria non focal exam    Commons side effects, risks, benefits, and alternatives for medications and treatment plan prescribed today were discussed, and the  patient expressed understanding of the given instructions. Patient is instructed to call or message via MyChart if he/she has any questions or concerns regarding our treatment plan. No barriers to understanding were identified. We discussed Red Flag symptoms and signs in detail. Patient expressed understanding regarding what to do in case of urgent or emergency type symptoms.  Medication list was reconciled, printed and provided to the patient in AVS. Patient instructions and summary information was reviewed with the patient as documented in the AVS. This note was prepared with assistance of Dragon voice recognition software. Occasional wrong-word or sound-a-like substitutions may have occurred due to the inherent limitations of voice recognition software  This visit occurred during the SARS-CoV-2 public health emergency.  Safety protocols were in place, including screening questions prior to the visit, additional usage of staff PPE, and extensive cleaning of exam room while observing appropriate contact time as indicated for disinfecting solutions.

## 2022-05-19 NOTE — Telephone Encounter (Signed)
Patient states he feels better - will call if he needs to see PCP -

## 2022-05-19 NOTE — Telephone Encounter (Signed)
Patyient is scheduled for appointment on 05/19/22

## 2022-05-19 NOTE — Patient Instructions (Signed)
Please follow up if symptoms do not improve or as needed.    Concussion, Adult  A concussion is a brain injury from a hard, direct hit (trauma) to the head or body. This direct hit causes the brain to shake quickly back and forth inside the skull. This can damage brain cells and cause chemical changes in the brain. A concussion may also be known as a mild traumatic brain injury (TBI). Concussions are usually not life-threatening, but the effects of a concussion can be serious. If you have a concussion, you should be very careful to avoid having a second concussion. What are the causes? This condition is caused by: A direct hit to your head, such as: Running into another player during a game. Being hit in a fight. Hitting your head on a hard surface. Sudden movement of your body that causes your brain to move back and forth inside the skull, such as in a car crash. What are the signs or symptoms? The signs of a concussion can be hard to notice. Early on, they may be missed by you, family members, and health care providers. You may look fine on the outside but may act or feel differently. Every head injury is different. Symptoms are usually temporary but may last for days, weeks, or even months. Some symptoms appear right away, but other symptoms may not show up for hours or days. If your symptoms last longer than normal, you may have post-concussion syndrome. Physical symptoms Headaches. Dizziness and problems with coordination or balance. Sensitivity to light or noise. Nausea or vomiting. Tiredness (fatigue). Vision or hearing problems. Changes in eating or sleeping patterns. Seizure. Mental and emotional symptoms Irritability or mood changes. Memory problems. Trouble concentrating, organizing, or making decisions. Slowness in thinking, acting or reacting, speaking, or reading. Anxiety or depression. How is this diagnosed? This condition is diagnosed based on: Your symptoms. A  description of your injury. You may also have tests, including: Imaging tests, such as a CT scan or an MRI. Neuropsychological tests. These measure your thinking, understanding, learning, and remembering abilities. How is this treated? Treatment for this condition includes: Stopping sports or activity if you are injured. If you hit your head or show signs of concussion: Do not return to sports or activities the same day. Get checked by a health care provider before you return to your activities. Physical and mental rest and careful observation, usually at home. Gradually return to your normal activities. Medicines to help with symptoms such as headaches, nausea, or difficulty sleeping. Avoid taking opioid pain medicine while recovering from a concussion. Avoiding alcohol and drugs. These may slow your recovery and can put you at risk of further injury. Referral to a concussion clinic or rehabilitation center. Recovery from a concussion can take time. How fast you recover depends on many factors. Return to activities only when: Your symptoms are completely gone. Your health care provider says that it is safe. Follow these instructions at home: Activity Limit activities that require a lot of thought or concentration, such as: Doing homework or job-related work. Watching TV. Working on the computer or phone. Playing memory games and puzzles. Rest. Rest helps your brain heal. Make sure you: Get plenty of sleep. Most adults should get 7-9 hours of sleep each night. Rest during the day. Take naps or rest breaks when you feel tired. Avoid physical activity like exercise until your health care provider says it is safe. Stop any activity that worsens symptoms. Do not do high-risk  activities that could cause a second concussion, such as riding a bike or playing sports. Ask your health care provider when you can return to your normal activities, such as school, work, athletics, and driving. Your  ability to react may be slower after a brain injury. Never do these activities if you are dizzy. Your health care provider will likely give you a plan for gradually returning to activities. General instructions  Take over-the-counter and prescription medicines only as told by your health care provider. Some medicines, such as blood thinners (anticoagulants) and aspirin, may increase the risk for complications, such as bleeding. Do not drink alcohol until your health care provider says you can. Watch your symptoms and tell others around you to do the same. Complications sometimes occur after a concussion. Older adults with a brain injury may have a higher risk of serious complications. Tell your work Freight forwarder, teachers, Government social research officer, school counselor, coach, or Product/process development scientist about your injury, symptoms, and restrictions. Keep all follow-up visits as told by your health care provider. This is important. How is this prevented? Avoiding another brain injury is very important. In rare cases, another injury can lead to permanent brain damage, brain swelling, or death. The risk of this is greatest during the first 7-10 days after a head injury. Avoid injuries by: Stopping activities that could lead to a second concussion, such as contact or recreational sports, until your health care provider says it is okay. Taking these actions once you have returned to sports or activities: Avoiding plays or moves that can cause you to crash into another person. This is how most concussions occur. Following the rules and being respectful of other players. Do not engage in violent or illegal plays. Getting regular exercise that includes strength and balance training. Wearing a properly fitting helmet during sports, biking, or other activities. Helmets can help protect you from serious skull and brain injuries, but they may not protect you from a concussion. Even when wearing a helmet, you should avoid being hit in the  head. Contact a health care provider if: Your symptoms do not improve. You have new symptoms. You have another injury. Get help right away if: You have new or worsening physical symptoms, such as: A severe or worsening headache. Weakness or numbness in any part of your body, slurred speech, vision changes, or confusion. Your coordination gets worse. Vomiting repeatedly. You have a seizure. You have unusual behavior changes. You lose consciousness, are sleepier than normal, or are difficult to wake up. These symptoms may represent a serious problem that is an emergency. Do not wait to see if the symptoms will go away. Get medical help right away. Call your local emergency services (911 in the [place] Do not drive yourself to the hospital. Summary A concussion is a brain injury that results from a hard, direct hit (trauma) to your head or body. You may have imaging tests and neuropsychological tests to diagnose a concussion. Treatment for this condition includes physical and mental rest and careful observation. Ask your health care provider when you can return to your normal activities, such as school, work, athletics, and driving. Get help right away if you have a severe headache, weakness in any part of the body, seizures, behavior changes, changes in vision, or if you are confused or sleepier than normal. This information is not intended to replace advice given to you by your health care provider. Make sure you discuss any questions you have with your health care provider. Document  Revised: 09/27/2020 Document Reviewed: 09/27/2020 Elsevier Patient Education  [place].

## 2022-05-28 ENCOUNTER — Encounter (HOSPITAL_BASED_OUTPATIENT_CLINIC_OR_DEPARTMENT_OTHER): Payer: Self-pay | Admitting: Emergency Medicine

## 2022-05-28 ENCOUNTER — Emergency Department (HOSPITAL_BASED_OUTPATIENT_CLINIC_OR_DEPARTMENT_OTHER): Payer: Managed Care, Other (non HMO)

## 2022-05-28 ENCOUNTER — Emergency Department (HOSPITAL_BASED_OUTPATIENT_CLINIC_OR_DEPARTMENT_OTHER)
Admission: EM | Admit: 2022-05-28 | Discharge: 2022-05-28 | Disposition: A | Discharge status: Home / Self Care | Payer: Managed Care, Other (non HMO) | Attending: Emergency Medicine | Admitting: Emergency Medicine

## 2022-05-28 ENCOUNTER — Other Ambulatory Visit: Payer: Self-pay

## 2022-05-28 DIAGNOSIS — R519 Headache, unspecified: Secondary | ICD-10-CM | POA: Diagnosis present

## 2022-05-28 DIAGNOSIS — Z79899 Other long term (current) drug therapy: Secondary | ICD-10-CM | POA: Insufficient documentation

## 2022-05-28 DIAGNOSIS — I10 Essential (primary) hypertension: Secondary | ICD-10-CM | POA: Diagnosis not present

## 2022-05-28 MED ORDER — METHOCARBAMOL 750 MG PO TABS: 750.0000 mg | ORAL_TABLET | Freq: Three times a day (TID) | ORAL | 0 refills | Status: DC | PRN

## 2022-05-28 MED ORDER — LISINOPRIL 5 MG PO TABS: 5.0000 mg | ORAL_TABLET | Freq: Every day | ORAL | 0 refills | Status: DC

## 2022-05-28 NOTE — ED Notes (Signed)
GCS 15, alert and oriented, appears comfortable, SBP 130's at DC time. MAE x 4, follows commands. AVS provided to client, informed that two (2) Rx's were electronically sent to his pharmacy. Confirmed pharmacy prior to DC to home. Also discussed pain control at home, importance of taking Medication for elevated BP. High BP/ HTN teaching also provided. Discussed when the need would arise for client to return to the ED as well. Opportunity for questions provided prior to DC from ED

## 2022-05-28 NOTE — Discharge Instructions (Addendum)
1.  Restart lisinopril 5 mg daily.  Measure your blood pressures and keep a journal recording pressures 2-3 times a day. 2.  Take exercise Tylenol every 6 hours as needed for headache.  You may also take Robaxin, muscle relaxer every 6-8 hours with the Tylenol. 3.  Schedule a recheck with your doctor within the next week.  Return to the emergency department immediately if you get a fever, suddenly worsening headache, vomiting, visual changes, weakness numbness incoordination or other concerning changes.

## 2022-05-28 NOTE — ED Provider Notes (Signed)
[facility] Provider Note   CSN: [id number] Arrival date & time: 05/28/22  1358     History  Chief Complaint  Patient presents with   Headache    Eugene Mitchell is a 46 y.o. male.  HPI Patient reports a couple weeks ago he was working on his deck.  He stood up quickly and hit his head very hard on the crown of the head on the joist of the deck.  He was not knocked out but reports it did bring him to tears.  He reports since that time he has been dealing with a nearly constant headache.  Headache is somewhat migratory.  Burning and tingling on the top of his head, other times aching at the temples and the forehead.  No visual changes.  No nausea no vomiting.  No fevers no chills no neck stiffness.  No incoordination.  No focal weakness numbness or tingling.  He gets some relief from Tylenol.  Patient does have prior history of hypertension.  He reports after exercise and diet his blood pressures normalized and he has been off of antihypertensives for about a year.  Over the past couple weeks since his injury his blood pressures have been running higher intermittently up to 160s over 100s.  He is unsure if his blood pressure is elevated due to hypertension or pain and anxiety.  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    Home Medications Prior to Admission medications   Medication Sig Start Date End Date Taking? Authorizing Provider  lisinopril (ZESTRIL) 5 MG tablet Take 1 tablet (5 mg total) by mouth daily. 05/28/22  Yes Charlesetta Shanks, MD  methocarbamol (ROBAXIN-750) 750 MG tablet Take 1 tablet (750 mg total) by mouth every 8 (eight) hours as needed for muscle spasms. 05/28/22  Yes Charlesetta Shanks, MD  citalopram (CELEXA) 40 MG tablet Take 1 tablet (40 mg total) by mouth daily. Patient not taking: Reported on 05/19/2022 08/07/20   Vivi Barrack, MD  econazole nitrate 1 % cream Apply  topically 2 (two) times daily. Patient not taking: Reported on 02/26/2022 10/31/21   Vivi Barrack, MD  fluorouracil (EFUDEX) 5 % cream Apply topically 2 (two) times daily. Patient not taking: Reported on 02/26/2022 09/26/20   [provider]  Triamcinolone Acetonide (TRIAMCINOLONE 0.1 % CREAM : EUCERIN) CREA Apply 1 application topically 2 (two) times daily. 08/07/20   Vivi Barrack, MD      Allergies    Esomeprazole    Review of Systems   Review of Systems  Physical Exam Updated Vital Signs BP (!) 163/106 (BP Location: Right Arm)   Pulse 68   Temp 98 F (36.7 C) (Oral)   Resp 18   Ht '5\' 8"'$  (1.727 m)   Wt 99.8 kg   SpO2 99%   BMI 33.45 kg/m  Physical Exam Constitutional:      Appearance: Normal appearance.  HENT:     Head: Normocephalic and atraumatic.     Comments: No hematomas or areas of apparent injury on physical exam.    Right Ear: Tympanic membrane normal.     Left Ear: Tympanic membrane normal.     Nose: Nose normal.     Mouth/Throat:     Mouth: Mucous membranes are moist.     Pharynx: Oropharynx is clear.     Comments: Dentition good condition. Eyes:     Extraocular Movements: Extraocular  movements intact.     Conjunctiva/sclera: Conjunctivae normal.     Pupils: Pupils are equal, round, and reactive to light.  Neck:     Comments: Neck supple.  No meningismus.  No reproducible paraspinous muscular pain.  Does endorse tightness and discomfort at bilateral occiput. Cardiovascular:     Rate and Rhythm: Normal rate and regular rhythm.  Pulmonary:     Effort: Pulmonary effort is normal.     Breath sounds: Normal breath sounds.  Musculoskeletal:        General: Normal range of motion.  Skin:    General: Skin is warm and dry.  Neurological:     General: No focal deficit present.     Mental Status: He is alert and oriented to person, place, and time.     Motor: No weakness.     Coordination: Coordination normal.  Psychiatric:        Mood and Affect:  Mood normal.     ED Results / Procedures / Treatments   Labs (all labs ordered are listed, but only abnormal results are displayed) Labs Reviewed - No data to display  EKG None  Radiology CT Head Wo Contrast  Result Date: 05/28/2022 CLINICAL DATA:  Headache, sudden, severe EXAM: CT HEAD WITHOUT CONTRAST TECHNIQUE: Contiguous axial images were obtained from the base of the skull through the vertex without intravenous contrast. RADIATION DOSE REDUCTION: This exam was performed according to the departmental dose-optimization program which includes automated exposure control, adjustment of the mA and/or kV according to patient size and/or use of iterative reconstruction technique. COMPARISON:  Head CT 10/16/2014 FINDINGS: Brain: No evidence of acute intracranial hemorrhage or extra-axial collection. No loss of gray-white matter differentiation.No evidence of mass lesion/concerning mass effect.The ventricles are normal in size. Vascular: No hyperdense vessel or unexpected calcification. Skull: Normal. Negative for fracture or focal lesion. Sinuses/Orbits: No acute findings. Unchanged polypoid mucosal thickening in the left maxillary and right sphenoid sinus. Other: None. IMPRESSION: No acute intracranial abnormality. Electronically Signed   By: Maurine Simmering M.D.   On: 05/28/2022 15:40    Procedures Procedures    Medications Ordered in ED Medications - No data to display  ED Course/ Medical Decision Making/ A&P                           Medical Decision Making  Patient presents for persistent headache after striking his head about 2 weeks ago.  At that time there was no loss of consciousness.  There has been no neurologic dysfunction.  He was referred to the emergency department from his outpatient provider for hypertension and persistent headache.  Physical exam is normal.  CT head obtained and no acute findings.  Patient is hypertensive at 160/106.  I suspect headache secondary to  hypertension.  Patient does not have any signs of endorgan damage.  He reports previously being on lisinopril which was discontinued.  At this time will have patient restart lisinopril at 5 mg daily.  He will document blood pressures, he will also take acetaminophen and Robaxin for headache that appears to have a tension headache quality as well.  Plan, diagnostic results and medications reviewed with the patient and his wife at bedside.  They voiced understanding.  Careful return precautions reviewed.        Final Clinical Impression(s) / ED Diagnoses Final diagnoses:  Bad headache  Hypertension, unspecified type    Rx / DC Orders ED Discharge Orders  Ordered    methocarbamol (ROBAXIN-750) 750 MG tablet  Every 8 hours PRN        05/28/22 1617    lisinopril (ZESTRIL) 5 MG tablet  Daily        05/28/22 1617              Charlesetta Shanks, MD 05/28/22 1624

## 2022-05-28 NOTE — ED Notes (Signed)
Pt has had HTN and HA x 2 weeks was sent here from work after nurse saw him

## 2022-05-28 NOTE — ED Triage Notes (Signed)
Patient presents POV C/O HTN and HA X2 weeks. Started after hitting head while doing some work on a deck, came up and hit top of his head. Was seen at PCP after injury but did not have imaging done. Patient was seen today by nurse at work for continued HA and HBP and was referred here for eval.

## 2022-06-09 ENCOUNTER — Encounter: Payer: Self-pay | Admitting: Family Medicine

## 2022-06-09 ENCOUNTER — Ambulatory Visit: Payer: Managed Care, Other (non HMO) | Admitting: Family Medicine

## 2022-06-09 VITALS — BP 130/70 | HR 55 | Temp 97.1°F | Ht 68.0 in | Wt 219.6 lb

## 2022-06-09 DIAGNOSIS — I10 Essential (primary) hypertension: Secondary | ICD-10-CM

## 2022-06-09 DIAGNOSIS — F321 Major depressive disorder, single episode, moderate: Secondary | ICD-10-CM | POA: Diagnosis not present

## 2022-06-09 DIAGNOSIS — R519 Headache, unspecified: Secondary | ICD-10-CM | POA: Diagnosis not present

## 2022-06-09 NOTE — Patient Instructions (Signed)
It was very nice to see you today!  I am glad that you are feeling better.  It is okay for you to stop your blood pressure medication.  Please keep an eye on your blood pressure and check in with [place] in a few weeks if it is running elevated.  I will see back in a few months for your annual physical.  Come back to see [place] sooner if needed.  Take care, Dr Jerline Pain  PLEASE NOTE:  If you had any lab tests please let us know if you have not heard back within a few days. You may see your results on mychart before we have a chance to review them but we will give you a call once they are reviewed by [place]. If we ordered any referrals today, please let us know if you have not heard from their office within the next week.   Please try these tips to maintain a healthy lifestyle:  Eat at least 3 REAL meals and 1-2 snacks per day.  Aim for no more than 5 hours between eating.  If you eat breakfast, please do so within one hour of getting up.   Each meal should contain half fruits/vegetables, one quarter protein, and one quarter carbs (no bigger than a computer mouse)  Cut down on sweet beverages. This includes juice, soda, and sweet tea.   Drink at least 1 glass of water with each meal and aim for at least 8 glasses per day  Exercise at least 150 minutes every week.

## 2022-06-09 NOTE — Assessment & Plan Note (Signed)
Blood pressure at goal off of lisinopril for last few days.  We will take this off his medication list.   Likely did have a transient elevation in blood pressure reading over the last few weeks due to concussion and likely worsening anxiety.He will continue to monitor at home and let us know if persistently elevated.

## 2022-06-09 NOTE — Assessment & Plan Note (Signed)
He has been under more stress recently however this now seems to be resolving.  He is doing well on Celexa 40 mg daily.  We will continue this.

## 2022-06-09 NOTE — Progress Notes (Signed)
   Eugene Mitchell is a 46 y.o. male who presents today for an office visit.  Assessment/Plan:  New/Acute Problems: Headache Likely had mild concussion that is now resolved.  It is reassuring he has not had any recurrence of headache for the last several days.  Has normal neuro exam today.  Given that symptoms have resolved do not do any other management at this point.  We discussed reasons to return to care.  Follow-up as needed.  Chronic Problems Addressed Today: Hypertension Blood pressure at goal off of lisinopril for last few days.  We will take this off his medication list.   Likely did have a transient elevation in blood pressure reading over the last few weeks due to concussion and likely worsening anxiety.He will continue to monitor at home and let us know if persistently elevated.   Depression, major, single episode, moderate ([place]) He has been under more stress recently however this now seems to be resolving.  He is doing well on Celexa 40 mg daily.  We will continue this.     Subjective:  HPI:  Patient here with head pain.    Symptoms started about 4 weeks ago. He has been working on building a deck at his house and knocked his head on the beams multiple times. He strick his head directly on his crown. No loss of consciousness. He saw a different provider here a few weeks ago and was diagnosed with a concussion. His blood pressure was elevated at this time. He ended up going to the ED about week later and ended up getting a CT scan due to elevated BP and persistent headache. CT scan was negative. He was restarted on lisinopril and told to follow up here. Over the last couple of weeks he has done well. HE has not taken his lisinopril the last couple of days. No headache the last few days. No weakness  or numbness.        Objective:  Physical Exam: BP 130/70   Pulse (!) 55   Temp (!) 97.1 F (36.2 C) (Temporal)   Ht '5\' 8"'$  (1.727 m)   Wt 219 lb 9.6 oz (99.6 kg)   SpO2 98%    BMI 33.39 kg/m   Gen: No acute distress, resting comfortably CV: Regular rate and rhythm with no murmurs appreciated Pulm: Normal work of breathing, clear to auscultation bilaterally with no crackles, wheezes, or rhonchi Neuro: Cranial nerves II through XII intact.  Finger-nose to finger testing intact bilaterally. Psych: Normal affect and thought content      Julie Paolini M. Jerline Pain, MD 06/09/2022 8:01 AM

## 2022-08-22 ENCOUNTER — Ambulatory Visit: Payer: Managed Care, Other (non HMO) | Admitting: Family Medicine

## 2022-09-05 ENCOUNTER — Encounter: Payer: Self-pay | Admitting: Family Medicine

## 2022-09-05 ENCOUNTER — Ambulatory Visit: Payer: Managed Care, Other (non HMO) | Admitting: Family Medicine

## 2022-09-05 VITALS — BP 138/83 | HR 65 | Temp 97.5°F | Ht 68.0 in | Wt 221.6 lb

## 2022-09-05 DIAGNOSIS — I10 Essential (primary) hypertension: Secondary | ICD-10-CM | POA: Diagnosis not present

## 2022-09-05 DIAGNOSIS — F321 Major depressive disorder, single episode, moderate: Secondary | ICD-10-CM | POA: Diagnosis not present

## 2022-09-05 MED ORDER — DICLOFENAC SODIUM 75 MG PO TBEC: 75.0000 mg | DELAYED_RELEASE_TABLET | Freq: Two times a day (BID) | ORAL | 0 refills | Status: DC

## 2022-09-05 NOTE — Patient Instructions (Addendum)
It was very nice to see you today!  I think you have have a muscle strain.  Please start the diclofenac.  Take twice daily for the next 7 to 10 days.  Let me know if not improving.  Take care, Dr Jerline Pain  PLEASE NOTE:  If you had any lab tests, please let us know if you have not heard back within a few days. You may see your results on mychart before we have a chance to review them but we will give you a call once they are reviewed by [place].   If we ordered any referrals today, please let us know if you have not heard from their office within the next week.   If you had any urgent prescriptions sent in today, please check with the pharmacy within an hour of our visit to make sure the prescription was transmitted appropriately.   Please try these tips to maintain a healthy lifestyle:  Eat at least 3 REAL meals and 1-2 snacks per day.  Aim for no more than 5 hours between eating.  If you eat breakfast, please do so within one hour of getting up.   Each meal should contain half fruits/vegetables, one quarter protein, and one quarter carbs (no bigger than a computer mouse)  Cut down on sweet beverages. This includes juice, soda, and sweet tea.   Drink at least 1 glass of water with each meal and aim for at least 8 glasses per day  Exercise at least 150 minutes every week.

## 2022-09-05 NOTE — Progress Notes (Signed)
   Eugene Mitchell is a 47 y.o. male who presents today for an office visit.  Assessment/Plan:  New/Acute Problems: Axilla Mitchell Likely muscular strain given Mitchell is reproduced on exam with abduction and extension.  No appreciable lymphadenopathy.  No red flag signs or symptoms.  Will start diclofenac 75 mg twice daily for 7 to 10 days.  Did discuss referral to physical therapy however deferred for now.  He has a follow-up with me in a couple weeks.  Will let me know if not improving.  Chronic Problems Addressed Today: Depression, major, single episode, moderate ([place]) Recently worsened due to stressful life events.  Will be seeing a therapist through work.  We did discuss starting him back on Celexa however he declined.  He has been off of this for the last 6 weeks or so and feels like he is doing well off of it.  Hypertension Blood pressure at goal today off of lisinopril.  Will continue to monitor.     Subjective:  HPI:  See A/p for status of chronic conditions.    Main concern today is axilla Mitchell. This started about a month ago. Located in bilateral axilla. Left worse than right. No obvious precipitating events but he did first notice this while sitting in the hot tub with the jets hitting him on his back. Sometimes worse with deep breath. Tried biofreeze with some improvement. Right side has improved the last few days. Some times radiates into his chest. No fevers or chills. No night sweats. No lumps. No recent changes in exercises.         Objective:  Physical Exam: BP 138/83   Pulse 65   Temp (!) 97.5 F (36.4 C) (Temporal)   Ht '5\' 8"'$  (1.727 m)   Wt 221 lb 9.6 oz (100.5 kg)   SpO2 97%   BMI 33.69 kg/m   Gen: No acute distress, resting comfortably CV: Regular rate and rhythm with no murmurs appreciated Pulm: Normal work of breathing, clear to auscultation bilaterally with no crackles, wheezes, or rhonchi MSK: - Axilla: No deformities bilaterally. No LAD.  Tenderness  to palpation along the lateral edge of scapula bilaterally.  Mitchell worsened with shoulder abduction and flexion.  No Mitchell elicited with internal or external rotation.  No Mitchell with resisted supraspinatus testing. Neuro: Grossly normal, moves all extremities Psych: Normal affect and thought content      Gaynel Schaafsma M. Eugene Pain, MD 09/05/2022 7:51 AM

## 2022-09-05 NOTE — Assessment & Plan Note (Signed)
Recently worsened due to stressful life events.  Will be seeing a therapist through work.  We did discuss starting him back on Celexa however he declined.  He has been off of this for the last 6 weeks or so and feels like he is doing well off of it.

## 2022-09-05 NOTE — Assessment & Plan Note (Signed)
Blood pressure at goal today off of lisinopril.  Will continue to monitor.

## 2022-09-19 ENCOUNTER — Encounter: Payer: Self-pay | Admitting: Family Medicine

## 2022-09-19 ENCOUNTER — Ambulatory Visit (INDEPENDENT_AMBULATORY_CARE_PROVIDER_SITE_OTHER): Payer: Managed Care, Other (non HMO) | Admitting: Family Medicine

## 2022-09-19 VITALS — BP 120/76 | HR 59 | Temp 97.8°F | Ht 68.0 in | Wt 221.8 lb

## 2022-09-19 DIAGNOSIS — I10 Essential (primary) hypertension: Secondary | ICD-10-CM | POA: Diagnosis not present

## 2022-09-19 DIAGNOSIS — E785 Hyperlipidemia, unspecified: Secondary | ICD-10-CM

## 2022-09-19 DIAGNOSIS — R351 Nocturia: Secondary | ICD-10-CM

## 2022-09-19 DIAGNOSIS — F321 Major depressive disorder, single episode, moderate: Secondary | ICD-10-CM

## 2022-09-19 DIAGNOSIS — Z0001 Encounter for general adult medical examination with abnormal findings: Secondary | ICD-10-CM | POA: Diagnosis not present

## 2022-09-19 DIAGNOSIS — R739 Hyperglycemia, unspecified: Secondary | ICD-10-CM | POA: Diagnosis not present

## 2022-09-19 DIAGNOSIS — E669 Obesity, unspecified: Secondary | ICD-10-CM | POA: Diagnosis not present

## 2022-09-19 LAB — CBC
HCT: 45.4 % (ref 39.0–52.0)
Hemoglobin: 15.3 g/dL (ref 13.0–17.0)
MCHC: 33.6 g/dL (ref 30.0–36.0)
MCV: 85.9 fl (ref 78.0–100.0)
Platelets: 223 10*3/uL (ref 150.0–400.0)
RBC: 5.28 Mil/uL (ref 4.22–5.81)
RDW: 13.3 % (ref 11.5–15.5)
WBC: 5.4 10*3/uL (ref 4.0–10.5)

## 2022-09-19 LAB — LIPID PANEL
Cholesterol: 207 mg/dL — ABNORMAL HIGH (ref 0–200)
HDL: 46.8 mg/dL (ref >39.00)
LDL Cholesterol: 136 mg/dL — ABNORMAL HIGH (ref 0–99)
NonHDL: 159.92
Total CHOL/HDL Ratio: 4
Triglycerides: 119 mg/dL (ref 0.0–149.0)
VLDL: 23.8 mg/dL (ref 0.0–40.0)

## 2022-09-19 LAB — COMPREHENSIVE METABOLIC PANEL
ALT: 28 U/L (ref 0–53)
AST: 17 U/L (ref 0–37)
Albumin: 4.8 g/dL (ref 3.5–5.2)
Alkaline Phosphatase: 65 U/L (ref 39–117)
BUN: 17 mg/dL (ref 6–23)
CO2: 30 mEq/L (ref 19–32)
Calcium: 9.6 mg/dL (ref 8.4–10.5)
Chloride: 103 mEq/L (ref 96–112)
Creatinine, Ser: 0.77 mg/dL (ref 0.40–1.50)
GFR: 106.17 mL/min (ref >60.00)
Glucose, Bld: 88 mg/dL (ref 70–99)
Potassium: 4.5 mEq/L (ref 3.5–5.1)
Sodium: 140 mEq/L (ref 135–145)
Total Bilirubin: 0.8 mg/dL (ref 0.2–1.2)
Total Protein: 7.2 g/dL (ref 6.0–8.3)

## 2022-09-19 LAB — PSA: PSA: 0.57 ng/mL (ref 0.10–4.00)

## 2022-09-19 LAB — HEMOGLOBIN A1C: Hgb A1c MFr Bld: 5.6 % (ref 4.6–6.5)

## 2022-09-19 LAB — TSH: TSH: 0.89 u[IU]/mL (ref 0.35–5.50)

## 2022-09-19 NOTE — Assessment & Plan Note (Signed)
Check lipids. Discussed lifestyle modifications.  

## 2022-09-19 NOTE — Assessment & Plan Note (Signed)
At goal today off lisinopril.  Continue to monitor at home.  Discussed lifestyle modifications.

## 2022-09-19 NOTE — Assessment & Plan Note (Signed)
BMI stable at 33.  Discussed lifestyle modifications.  Will continue to work on diet and exercise.  We discussed referral to the weight management however he declined.

## 2022-09-19 NOTE — Progress Notes (Signed)
Chief Complaint:  Eugene Mitchell is a 47 y.o. male who presents today for his annual comprehensive physical exam.    Assessment/Plan:  Chronic Problems Addressed Today: Hypertension At goal today off lisinopril.  Continue to monitor at home.  Discussed lifestyle modifications.  Hyperlipidemia Check lipids.  Discussed lifestyle modifications.  Obesity BMI stable at 33.  Discussed lifestyle modifications.  Will continue to work on diet and exercise.  We discussed referral to the weight management however he declined.  Depression, major, single episode, moderate (HCC) Symptoms are stable off meds.   Preventative Healthcare: Flu vaccine declined.  Check labs.  Up-to-date on colon cancer screening.  Follows with dermatology routinely for skin cancer checks.  Patient Counseling(The following topics were reviewed and/or handout was given):  -Nutrition: Stressed importance of moderation in sodium/caffeine intake, saturated fat and cholesterol, caloric balance, sufficient intake of fresh fruits, vegetables, and fiber.  -Stressed the importance of regular exercise.   -Substance Abuse: Discussed cessation/primary prevention of tobacco, alcohol, or other drug use; driving or other dangerous activities under the influence; availability of treatment for abuse.   -Injury prevention: Discussed safety belts, safety helmets, smoke detector, smoking near bedding or upholstery.   -Sexuality: Discussed sexually transmitted diseases, partner selection, use of condoms, avoidance of unintended pregnancy and contraceptive alternatives.   -Dental health: Discussed importance of regular tooth brushing, flossing, and dental visits.  -Health maintenance and immunizations reviewed. Please refer to Health maintenance section.  Return to care in 1 year for next preventative visit.     Subjective:  HPI:  He has no acute complaints today.   We saw him 2 weeks ago for axilla pain. Started on diclofenac and  symptoms have resolved.   Lifestyle Diet: Balanced. Trying to cut down on sweets and carbs.  Exercise: Cycling routinely.      09/19/2022   10:48 AM  Depression screen PHQ 2/9  Decreased Interest 0  Down, Depressed, Hopeless 0  PHQ - 2 Score 0  Altered sleeping 0  Tired, decreased energy 0  Change in appetite 0  Feeling bad or failure about yourself  0  Trouble concentrating 0  Moving slowly or fidgety/restless 0  Suicidal thoughts 0  PHQ-9 Score 0  Difficult doing work/chores Not difficult at all    ROS: Per HPI, otherwise a complete review of systems was negative.   PMH:  The following were reviewed and entered/updated in epic: Past Medical History:  Diagnosis Date   Depression    Hypercholesteremia    Hypertension    TIA (transient ischemic attack) 2016   Patient Active Problem List   Diagnosis Date Noted   Obesity 11/30/2020   Depression, major, single episode, moderate ([place]) 08/07/2020   Rash 02/04/2019   Skin cancer 07/07/2018   Hyperlipidemia 10/18/2014   Hypertension 12/30/2012   Past Surgical History:  Procedure Laterality Date   CHOLECYSTECTOMY     ELBOW SURGERY Left     Family History  Problem Relation Age of Onset   Thyroid disease Mother    Cancer Mother        Lung   Stroke Father    Coronary artery disease Father    Cancer Father        Lung   Colon cancer Neg Hx    Esophageal cancer Neg Hx    Rectal cancer Neg Hx    Stomach cancer Neg Hx     Medications- reviewed and updated Current Outpatient Medications  Medication Sig Dispense Refill  econazole nitrate 1 % cream Apply topically 2 (two) times daily. 15 g 1   fluorouracil (EFUDEX) 5 % cream Apply topically 2 (two) times daily.     Triamcinolone Acetonide (TRIAMCINOLONE 0.1 % CREAM : EUCERIN) CREA Apply 1 application topically 2 (two) times daily. 1 each 3   No current facility-administered medications for this visit.    Allergies-reviewed and updated Allergies  Allergen  Reactions   Esomeprazole Swelling    Social History   Socioeconomic History   Marital status: Married    Spouse name: Not on file   Number of children: 2   Years of education: 12   Highest education level: Not on file  Occupational History   Occupation: Physiological scientist  Tobacco Use   Smoking status: Former    Types: Cigarettes    Quit date: 10/12/2011    Years since quitting: 10.9   Smokeless tobacco: Never  Vaping Use   Vaping Use: Never used  Substance and Sexual Activity   Alcohol use: Yes    Comment: Socially   Drug use: No   Sexual activity: Yes    Partners: Male  Other Topics Concern   Not on file  Social History Narrative   Lives at home with his parents.   Left-handed.   3-6 cups caffeine daily.   Social Determinants of Health   Financial Resource Strain: Not on file  Food Insecurity: Not on file  Transportation Needs: Not on file  Physical Activity: Not on file  Stress: Not on file  Social Connections: Not on file        Objective:  Physical Exam: BP 120/76   Pulse (!) 59   Temp 97.8 F (36.6 C) (Temporal)   Ht '5\' 8"'$  (1.727 m)   Wt 221 lb 12.8 oz (100.6 kg)   SpO2 97%   BMI 33.72 kg/m   Body mass index is 33.72 kg/m. Wt Readings from Last 3 Encounters:  09/19/22 221 lb 12.8 oz (100.6 kg)  09/05/22 221 lb 9.6 oz (100.5 kg)  06/09/22 219 lb 9.6 oz (99.6 kg)   Gen: NAD, resting comfortably HEENT: TMs normal bilaterally. OP clear. No thyromegaly noted.  CV: RRR with no murmurs appreciated Pulm: NWOB, CTAB with no crackles, wheezes, or rhonchi GI: Normal bowel sounds present. Soft, Nontender, Nondistended. MSK: no edema, cyanosis, or clubbing noted Skin: warm, dry Neuro: CN2-12 grossly intact. Strength 5/5 in upper and lower extremities. Reflexes symmetric and intact bilaterally.  Psych: Normal affect and thought content     Eugene Mitchell M. Jerline Pain, MD 09/19/2022 11:52 AM

## 2022-09-19 NOTE — Patient Instructions (Signed)
It was very nice to see you today!  We will check blood work today.   Keep up the good work with diet and exercise.  I will see back in year for your next physical.  Come back to see [place] sooner if needed.  Take care, Dr Jerline Pain  PLEASE NOTE:  If you had any lab tests, please let us know if you have not heard back within a few days. You may see your results on mychart before we have a chance to review them but we will give you a call once they are reviewed by [place].   If we ordered any referrals today, please let us know if you have not heard from their office within the next week.   If you had any urgent prescriptions sent in today, please check with the pharmacy within an hour of our visit to make sure the prescription was transmitted appropriately.   Please try these tips to maintain a healthy lifestyle:  Eat at least 3 REAL meals and 1-2 snacks per day.  Aim for no more than 5 hours between eating.  If you eat breakfast, please do so within one hour of getting up.   Each meal should contain half fruits/vegetables, one quarter protein, and one quarter carbs (no bigger than a computer mouse)  Cut down on sweet beverages. This includes juice, soda, and sweet tea.   Drink at least 1 glass of water with each meal and aim for at least 8 glasses per day  Exercise at least 150 minutes every week.    Preventive Care 32-65 Years Old, Male Preventive care refers to lifestyle choices and visits with your health care provider that can promote health and wellness. Preventive care visits are also called wellness exams. What can I expect for my preventive care visit? Counseling During your preventive care visit, your health care provider may ask about your: Medical history, including: Past medical problems. Family medical history. Current health, including: Emotional well-being. Home life and relationship well-being. Sexual activity. Lifestyle, including: Alcohol, nicotine or tobacco, and  drug use. Access to firearms. Diet, exercise, and sleep habits. Safety issues such as seatbelt and bike helmet use. Sunscreen use. Work and work Statistician. Physical exam Your health care provider will check your: Height and weight. These may be used to calculate your BMI (body mass index). BMI is a measurement that tells if you are at a healthy weight. Waist circumference. This measures the distance around your waistline. This measurement also tells if you are at a healthy weight and may help predict your risk of certain diseases, such as type 2 diabetes and high blood pressure. Heart rate and blood pressure. Body temperature. Skin for abnormal spots. What immunizations do I need?  Vaccines are usually given at various ages, according to a schedule. Your health care provider will recommend vaccines for you based on your age, medical history, and lifestyle or other factors, such as travel or where you work. What tests do I need? Screening Your health care provider may recommend screening tests for certain conditions. This may include: Lipid and cholesterol levels. Diabetes screening. This is done by checking your blood sugar (glucose) after you have not eaten for a while (fasting). Hepatitis B test. Hepatitis C test. HIV (human immunodeficiency virus) test. STI (sexually transmitted infection) testing, if you are at risk. Lung cancer screening. Prostate cancer screening. Colorectal cancer screening. Talk with your health care provider about your test results, treatment options, and if necessary, the need  for more tests. Follow these instructions at home: Eating and drinking  Eat a diet that includes fresh fruits and vegetables, whole grains, lean protein, and low-fat dairy products. Take vitamin and mineral supplements as recommended by your health care provider. Do not drink alcohol if your health care provider tells you not to drink. If you drink alcohol: Limit how much you  have to 0-2 drinks a day. Know how much alcohol is in your drink. In the [place] one drink equals one 12 oz bottle of beer (355 mL), one 5 oz glass of wine (148 mL), or one 1 oz glass of hard liquor (44 mL). Lifestyle Brush your teeth every morning and night with fluoride toothpaste. Floss one time each day. Exercise for at least 30 minutes 5 or more days each week. Do not use any products that contain nicotine or tobacco. These products include cigarettes, chewing tobacco, and vaping devices, such as e-cigarettes. If you need help quitting, ask your health care provider. Do not use drugs. If you are sexually active, practice safe sex. Use a condom or other form of protection to prevent STIs. Take aspirin only as told by your health care provider. Make sure that you understand how much to take and what form to take. Work with your health care provider to find out whether it is safe and beneficial for you to take aspirin daily. Find healthy ways to manage stress, such as: Meditation, yoga, or listening to music. Journaling. Talking to a trusted person. Spending time with friends and family. Minimize exposure to UV radiation to reduce your risk of skin cancer. Safety Always wear your seat belt while driving or riding in a vehicle. Do not drive: If you have been drinking alcohol. Do not ride with someone who has been drinking. When you are tired or distracted. While texting. If you have been using any mind-altering substances or drugs. Wear a helmet and other protective equipment during sports activities. If you have firearms in your house, make sure you follow all gun safety procedures. What's next? Go to your health care provider once a year for an annual wellness visit. Ask your health care provider how often you should have your eyes and teeth checked. Stay up to date on all vaccines. This information is not intended to replace advice given to you by your health care provider. Make sure  you discuss any questions you have with your health care provider. Document Revised: 01/09/2021 Document Reviewed: 01/09/2021 Elsevier Patient Education  [place].

## 2022-09-19 NOTE — Assessment & Plan Note (Signed)
Symptoms are stable off meds.

## 2022-09-23 NOTE — Progress Notes (Signed)
Please inform patient of the following:  Cholesterol is elevated but overall stable.  Everything else is normal.  He should continue to work on diet and exercise and we can recheck in a year.

## 2023-01-01 ENCOUNTER — Ambulatory Visit: Payer: Managed Care, Other (non HMO) | Admitting: Family Medicine

## 2023-01-01 ENCOUNTER — Encounter: Payer: Self-pay | Admitting: Family Medicine

## 2023-01-01 VITALS — BP 117/76 | HR 59 | Temp 97.7°F | Ht 68.0 in | Wt 217.0 lb

## 2023-01-01 DIAGNOSIS — F321 Major depressive disorder, single episode, moderate: Secondary | ICD-10-CM | POA: Diagnosis not present

## 2023-01-01 DIAGNOSIS — I10 Essential (primary) hypertension: Secondary | ICD-10-CM | POA: Diagnosis not present

## 2023-01-01 MED ORDER — CITALOPRAM HYDROBROMIDE 20 MG PO TABS: 20.0000 mg | ORAL_TABLET | Freq: Every day | ORAL | 3 refills | Status: DC

## 2023-01-01 NOTE — Progress Notes (Signed)
   ELIAKIM PUZON is a 47 y.o. male who presents today for an office visit.  Assessment/Plan:  Chronic Problems Addressed Today: Depression, major, single episode, moderate (HCC) Symptoms have worsened mostly due to work stress. 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 We discussed treatment options. He has previously done well on celexa. Will start back at 20 mg daily. We discussed referral to see a therapist however he declined.  He does have a previous therapist that he may reach out to discuss management strategies.Marland Kitchen He will follow up with me in a couple of weeks and we can adjust medications as needed.    Hypertension At goal today off medications.      Subjective:  HPI:  See Assessment / plan for status of chronic conditions.  His main concern today is worsening stress and anxiety. He has been under a lot more stress at work.  They have put a lot more on his plate and feels like he is overloaded. 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 He is currently looking for another job but cannot leave due to financial constraints. He feels like this is spilling over into his personal life as well. Constantly thinks about work while at home. Thinks that this is taking away from time with his family. He has also noticed that he has started making a clicking sound in his throat. He has also had more stress within then family.        Objective:  Physical Exam: BP 117/76   Pulse (!) 59   Temp 97.7 F (36.5 C) (Temporal)   Ht 5\' 8"  (1.727 m)   Wt 217 lb (98.4 kg)   SpO2 96%   BMI 32.99 kg/m   Gen: No acute distress, resting comfortably Neuro: Grossly normal, moves all extremities Psych: Normal affect and thought content      Brenisha Tsui M. Jimmey Ralph, MD 01/01/2023 9:23 AM

## 2023-01-01 NOTE — Patient Instructions (Addendum)
It was very nice to see you today!  We will start Celexa.  Let me know in a few weeks how this is working for you.   Return in about 2 weeks (around 01/15/2023).   Take care, Dr Jimmey Ralph  PLEASE NOTE:  If you had any lab tests, please let us know if you have not heard back within a few days. You may see your results on mychart before we have a chance to review them but we will give you a call once they are reviewed by [place].   If we ordered any referrals today, please let us know if you have not heard from their office within the next week.   If you had any urgent prescriptions sent in today, please check with the pharmacy within an hour of our visit to make sure the prescription was transmitted appropriately.   Please try these tips to maintain a healthy lifestyle:  Eat at least 3 REAL meals and 1-2 snacks per day.  Aim for no more than 5 hours between eating.  If you eat breakfast, please do so within one hour of getting up.   Each meal should contain half fruits/vegetables, one quarter protein, and one quarter carbs (no bigger than a computer mouse)  Cut down on sweet beverages. This includes juice, soda, and sweet tea.   Drink at least 1 glass of water with each meal and aim for at least 8 glasses per day  Exercise at least 150 minutes every week.

## 2023-01-01 NOTE — Assessment & Plan Note (Signed)
At goal today off medications. 

## 2023-01-01 NOTE — Assessment & Plan Note (Addendum)
Symptoms have worsened mostly due to work stress. 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 We discussed treatment options. He has previously done well on celexa. Will start back at 20 mg daily. We discussed referral to see a therapist however he declined.  He does have a previous therapist that he may reach out to discuss management strategies.Eugene Mitchell He will follow up with me in a couple of weeks and we can adjust medications as needed.

## 2023-01-15 ENCOUNTER — Encounter: Payer: Self-pay | Admitting: Family Medicine

## 2023-01-15 ENCOUNTER — Ambulatory Visit: Payer: Managed Care, Other (non HMO) | Admitting: Family Medicine

## 2023-01-15 VITALS — BP 123/79 | HR 62 | Temp 97.5°F | Ht 68.0 in | Wt 211.8 lb

## 2023-01-15 DIAGNOSIS — I10 Essential (primary) hypertension: Secondary | ICD-10-CM

## 2023-01-15 DIAGNOSIS — F321 Major depressive disorder, single episode, moderate: Secondary | ICD-10-CM

## 2023-01-15 MED ORDER — CITALOPRAM HYDROBROMIDE 40 MG PO TABS: 40.0000 mg | ORAL_TABLET | Freq: Every day | ORAL | 3 refills | Status: DC

## 2023-01-15 NOTE — Assessment & Plan Note (Signed)
At goal today off medications. 

## 2023-01-15 NOTE — Patient Instructions (Signed)
It was very nice to see you today!  We will increase your celexa to 40 mg today.   Return in about 4 weeks (around 02/12/2023) for Follow Up.   Take care, Dr Jimmey Ralph  PLEASE NOTE:  If you had any lab tests, please let us know if you have not heard back within a few days. You may see your results on mychart before we have a chance to review them but we will give you a call once they are reviewed by [place].   If we ordered any referrals today, please let us know if you have not heard from their office within the next week.   If you had any urgent prescriptions sent in today, please check with the pharmacy within an hour of our visit to make sure the prescription was transmitted appropriately.   Please try these tips to maintain a healthy lifestyle:  Eat at least 3 REAL meals and 1-2 snacks per day.  Aim for no more than 5 hours between eating.  If you eat breakfast, please do so within one hour of getting up.   Each meal should contain half fruits/vegetables, one quarter protein, and one quarter carbs (no bigger than a computer mouse)  Cut down on sweet beverages. This includes juice, soda, and sweet tea.   Drink at least 1 glass of water with each meal and aim for at least 8 glasses per day  Exercise at least 150 minutes every week.

## 2023-01-15 NOTE — Assessment & Plan Note (Signed)
Symptoms are still not controlled. He is working with his therapist to help with stress management strategies. He is under a lot of stress at work. We will increase his celexa to 40 mg daily. Hopefully, he will get some more assistance at work. He will continue working with his therapist. Follow up in 2-4 weeks.

## 2023-01-15 NOTE — Progress Notes (Signed)
   Eugene Mitchell is a 47 y.o. male who presents today for an office visit.  Assessment/Plan:  Chronic Problems Addressed Today: Depression, major, single episode, moderate (HCC) Symptoms are still not controlled. He is working with his therapist to help with stress management strategies. He is under a lot of stress at work. We will increase his celexa to 40 mg daily. Hopefully, he will get some more assistance at work. He will continue working with his therapist. Follow up in 2-4 weeks.   Hypertension At goal today off medications.     Subjective:  HPI:  See A/P for status of chronic conditions.  Patient is here today for follow-up.  We saw him 2 weeks ago.  At that visit we discussed management for his depression.  We started back on Celexa 20 mg daily.  He has still had persistent symptoms and still under a lot of stress at work. He has started seeing his therapist since our last visit. They are trying to work on readjusting things at work to help him with stress levels. He is considering getting FMLA.       Objective:  Physical Exam: BP 123/79   Pulse 62   Temp (!) 97.5 F (36.4 C) (Temporal)   Ht 5\' 8"  (1.727 m)   Wt 211 lb 12.8 oz (96.1 kg)   SpO2 97%   BMI 32.20 kg/m   Gen: No acute distress, resting comfortably Neuro: Grossly normal, moves all extremities Psych: Normal affect and thought content      Eugene Mitchell M. Jimmey Ralph, MD 01/15/2023 9:06 AM

## 2023-01-26 ENCOUNTER — Encounter: Payer: Self-pay | Admitting: Family Medicine

## 2023-01-26 NOTE — Telephone Encounter (Signed)
We should stop the medication. Recommend he go to half a dose for 1 week and then discontinue.  Please have him follow-up here ASAP.

## 2023-01-26 NOTE — Telephone Encounter (Signed)
Please see message and advise 

## 2023-01-27 ENCOUNTER — Encounter: Payer: Self-pay | Admitting: Family Medicine

## 2023-01-27 ENCOUNTER — Ambulatory Visit: Payer: Managed Care, Other (non HMO) | Admitting: Family Medicine

## 2023-01-27 VITALS — BP 133/81 | HR 54 | Temp 97.7°F | Ht 68.0 in | Wt 209.0 lb

## 2023-01-27 DIAGNOSIS — F321 Major depressive disorder, single episode, moderate: Secondary | ICD-10-CM

## 2023-01-27 MED ORDER — BUPROPION HCL ER (XL) 150 MG PO TB24: 150.0000 mg | ORAL_TABLET | Freq: Every day | ORAL | 5 refills | Status: DC

## 2023-01-27 NOTE — Patient Instructions (Signed)
It was very nice to see you today!  We will start Wellbutrin.  Please continue to wean down the Celexa.  Let me know in a few weeks how you are doing.  Return in about 2 weeks (around 02/10/2023).   Take care, Dr Jimmey Ralph  PLEASE NOTE:  If you had any lab tests, please let us know if you have not heard back within a few days. You may see your results on mychart before we have a chance to review them but we will give you a call once they are reviewed by [place].   If we ordered any referrals today, please let us know if you have not heard from their office within the next week.   If you had any urgent prescriptions sent in today, please check with the pharmacy within an hour of our visit to make sure the prescription was transmitted appropriately.   Please try these tips to maintain a healthy lifestyle:  Eat at least 3 REAL meals and 1-2 snacks per day.  Aim for no more than 5 hours between eating.  If you eat breakfast, please do so within one hour of getting up.   Each meal should contain half fruits/vegetables, one quarter protein, and one quarter carbs (no bigger than a computer mouse)  Cut down on sweet beverages. This includes juice, soda, and sweet tea.   Drink at least 1 glass of water with each meal and aim for at least 8 glasses per day  Exercise at least 150 minutes every week.

## 2023-01-27 NOTE — Progress Notes (Signed)
   Eugene Mitchell is a 47 y.o. male who presents today for an office visit.  Assessment/Plan:  Chronic Problems Addressed Today: Depression, major, single episode, moderate (HCC) Symptoms are not controlled.  Had a lengthy discussion with patient and his wife today regarding his management plan.  He is under quite a bit of stress at work however this hopefully will be improved now that he will be taking a leave from work for the next several weeks.  He is working with his therapist for stress management strategies which does seem to be going well.  He is currently weaning down the Celexa.  We did discuss other medication options.  It is not clear if his suicidal thoughts were due to a non response of Celexa and progression of his depression or if this was an actual side effect of Celexa however we are weaning off regardless.  He is open to trying other medications.  We will start Wellbutrin 150 mg daily.  We discussed potential side effects.  He will follow-up with me in 1 to 2 weeks and we can titrate as needed.  Depending on response would consider augmentation or monotherapy with Abilify.  It does seem like most of his stress was situational related to his work and hopefully his mood will improve with his leave of absence.  If continues to have significant issues despite above would consider referral to psychiatry.  We did discuss safety.  He has had some passive suicidal ideation but has no active plans and has given guns to his neighbor.  His wife is with him who also provides great support.  He will let us know if he has any worsening or recurrence of these thoughts.      Subjective:  HPI:  Patient here for depression follow up.  We last saw him a couple weeks ago for this.  He has been dealing with a lot of stress at work.  We started him on Celexa about a month ago and increase this to 40 mg daily a couple weeks ago.  He has been working with a Paramedic.  Unfortunately things at work  have continued to be a major issue and he had a "breakdown" at work yesterday.  He ended up leaving work and is planning on taking a 6-week leave from the direction of his therapist.  He does not think the Celexa has been effective.  He does have some passing thoughts of thinking he would be better off of he were dead or not here.  He does not have any active suicidal plans and gave his guns to his neighbor for safekeeping until he can get in a better mental space.  He did send a MyChart message earlier this week about the Celexa and has started to wean down on this.      Objective:  Physical Exam: BP 133/81   Pulse (!) 54   Temp 97.7 F (36.5 C) (Temporal)   Ht 5\' 8"  (1.727 m)   Wt 209 lb (94.8 kg)   SpO2 96%   BMI 31.78 kg/m   Gen: No acute distress, resting comfortably Neuro: Grossly normal, moves all extremities Psych: Intermittently tearful, normal thought content      Eugene Mitchell M. Jimmey Ralph, MD 01/27/2023 8:36 AM

## 2023-01-27 NOTE — Assessment & Plan Note (Signed)
Symptoms are not controlled.  Had a lengthy discussion with patient and his wife today regarding his management plan.  He is under quite a bit of stress at work however this hopefully will be improved now that he will be taking a leave from work for the next several weeks.  He is working with his therapist for stress management strategies which does seem to be going well.  He is currently weaning down the Celexa.  We did discuss other medication options.  It is not clear if his suicidal thoughts were due to a non response of Celexa and progression of his depression or if this was an actual side effect of Celexa however we are weaning off regardless.  He is open to trying other medications.  We will start Wellbutrin 150 mg daily.  We discussed potential side effects.  He will follow-up with me in 1 to 2 weeks and we can titrate as needed.  Depending on response would consider augmentation or monotherapy with Abilify.  It does seem like most of his stress was situational related to his work and hopefully his mood will improve with his leave of absence.  If continues to have significant issues despite above would consider referral to psychiatry.  We did discuss safety.  He has had some passive suicidal ideation but has no active plans and has given guns to his neighbor.  His wife is with him who also provides great support.  He will let us know if he has any worsening or recurrence of these thoughts.

## 2023-01-28 ENCOUNTER — Encounter: Payer: Self-pay | Admitting: Family Medicine

## 2023-01-28 NOTE — Telephone Encounter (Signed)
Please advise 

## 2023-02-09 ENCOUNTER — Encounter: Payer: Self-pay | Admitting: Family Medicine

## 2023-02-09 ENCOUNTER — Ambulatory Visit (INDEPENDENT_AMBULATORY_CARE_PROVIDER_SITE_OTHER): Payer: Managed Care, Other (non HMO) | Admitting: Family Medicine

## 2023-02-09 VITALS — BP 134/78 | HR 64 | Temp 96.4°F | Ht 68.0 in | Wt 210.2 lb

## 2023-02-09 DIAGNOSIS — Z23 Encounter for immunization: Secondary | ICD-10-CM | POA: Diagnosis not present

## 2023-02-09 DIAGNOSIS — R519 Headache, unspecified: Secondary | ICD-10-CM | POA: Diagnosis not present

## 2023-02-09 DIAGNOSIS — F321 Major depressive disorder, single episode, moderate: Secondary | ICD-10-CM | POA: Diagnosis not present

## 2023-02-09 MED ORDER — BUPROPION HCL ER (XL) 150 MG PO TB24: 300.0000 mg | ORAL_TABLET | Freq: Every day | ORAL | 5 refills | Status: DC

## 2023-02-09 NOTE — Progress Notes (Signed)
   Eugene Mitchell is a 47 y.o. male who presents today for an office visit.  Assessment/Plan:  Chronic Problems Addressed Today: Depression, major, single episode, moderate (HCC) Symptoms are improving with Wellbutrin.  No SI or HI. He has been tolerating well.  He is agreeable to increase the dose.  We will go to 300 mg daily.  He will continue working with his therapist as well.  They are working on several stress management strategies which seems to be going well.  He will follow-up with me in a couple of weeks via MyChart or in person.  We can titrate the dose as needed.  Depending on response we could consider augmentation with an SSRI or Abilify.  Will complete FMLA paperwork today.  Headache It is reassuring that symptoms have been improving the last couple of weeks.  No red flags.  Sounds like he is probably having tension headaches related to his stress.  He did have a head CT scan done about a month ago which was negative.  We discussed repeating imaging today however he deferred.  Hopefully this will continue to improve as we are managing his stress and depression as above.  We did discuss reasons to return to care or seek emergent care.  If he has any development of new symptoms or if symptoms do not continue to improve would consider MRI brain without contrast at that time.     Subjective:  HPI:  See Assessment / plan for status of chronic conditions.  He was last seen here a couple of weeks.  He is here today for follow-up.  Last time that we saw him he was not doing well with Celexa.  We started him on Wellbutrin 150 mg daily.  He has been doing reasonably well with this for the last couple of weeks.  Still has a lot of depression and stress however this does seem to be improving.  He has not had any bad side effects with the Wellbutrin.  He is continue to work with the therapist this is going well.  He still having ongoing daily headaches.  This happens randomly throughout  the day.  Sometimes gets a tight or throbbing sensation in the top part of his head.  Symptoms are usually pretty mild.  Usually go away with medications.  He does note that headaches are getting better and that stress is a big trigger for him.  No nausea or vomiting.  No vision changes.  No weakness or numbness.       Objective:  Physical Exam: BP 134/78   Pulse 64   Temp (!) 96.4 F (35.8 C) (Temporal)   Ht 5\' 8"  (1.727 m)   Wt 210 lb 3.2 oz (95.3 kg)   SpO2 95%   BMI 31.96 kg/m   Gen: No acute distress, resting comfortably Neuro: Grossly normal, moves all extremities Psych: Normal affect and thought content      Eugene Mitchell M. Eugene Ralph, MD 02/09/2023 2:15 PM

## 2023-02-09 NOTE — Assessment & Plan Note (Signed)
Symptoms are improving with Wellbutrin.  No SI or HI. He has been tolerating well.  He is agreeable to increase the dose.  We will go to 300 mg daily.  He will continue working with his therapist as well.  They are working on several stress management strategies which seems to be going well.  He will follow-up with me in a couple of weeks via MyChart or in person.  We can titrate the dose as needed.  Depending on response we could consider augmentation with an SSRI or Abilify.  Will complete FMLA paperwork today.

## 2023-02-09 NOTE — Assessment & Plan Note (Signed)
It is reassuring that symptoms have been improving the last couple of weeks.  No red flags.  Sounds like he is probably having tension headaches related to his stress.  He did have a head CT scan done about a month ago which was negative.  We discussed repeating imaging today however he deferred.  Hopefully this will continue to improve as we are managing his stress and depression as above.  We did discuss reasons to return to care or seek emergent care.  If he has any development of new symptoms or if symptoms do not continue to improve would consider MRI brain without contrast at that time.

## 2023-02-09 NOTE — Patient Instructions (Signed)
It was very nice to see you today!  We will increase your Wellbutrin to 300 mg daily.  Please follow-up with me in a couple weeks to let me know how you are doing.   Return in about 2 weeks (around 02/23/2023).   Take care, Dr Jimmey Ralph  PLEASE NOTE:  If you had any lab tests, please let us know if you have not heard back within a few days. You may see your results on mychart before we have a chance to review them but we will give you a call once they are reviewed by [place].   If we ordered any referrals today, please let us know if you have not heard from their office within the next week.   If you had any urgent prescriptions sent in today, please check with the pharmacy within an hour of our visit to make sure the prescription was transmitted appropriately.   Please try these tips to maintain a healthy lifestyle:  Eat at least 3 REAL meals and 1-2 snacks per day.  Aim for no more than 5 hours between eating.  If you eat breakfast, please do so within one hour of getting up.   Each meal should contain half fruits/vegetables, one quarter protein, and one quarter carbs (no bigger than a computer mouse)  Cut down on sweet beverages. This includes juice, soda, and sweet tea.   Drink at least 1 glass of water with each meal and aim for at least 8 glasses per day  Exercise at least 150 minutes every week.

## 2023-02-11 ENCOUNTER — Ambulatory Visit: Payer: Managed Care, Other (non HMO) | Admitting: Family Medicine

## 2023-02-12 ENCOUNTER — Ambulatory Visit: Payer: Managed Care, Other (non HMO) | Admitting: Family Medicine

## 2023-02-24 ENCOUNTER — Ambulatory Visit: Payer: Managed Care, Other (non HMO) | Admitting: Family Medicine

## 2023-03-03 ENCOUNTER — Encounter: Payer: Self-pay | Admitting: Family Medicine

## 2023-03-04 ENCOUNTER — Encounter: Payer: Self-pay | Admitting: Family Medicine

## 2023-03-04 NOTE — Telephone Encounter (Signed)
Ok to give letter with extended date.  Katina Degree. Jimmey Ralph, MD 03/04/2023 9:03 AM

## 2023-03-04 NOTE — Telephone Encounter (Signed)
Patient notified letter send via [facility]

## 2023-03-05 ENCOUNTER — Ambulatory Visit: Payer: Managed Care, Other (non HMO) | Admitting: Family Medicine

## 2023-03-10 ENCOUNTER — Encounter: Payer: Self-pay | Admitting: Family Medicine

## 2023-03-10 ENCOUNTER — Ambulatory Visit: Payer: Managed Care, Other (non HMO) | Admitting: Family Medicine

## 2023-03-10 NOTE — Telephone Encounter (Signed)
See note

## 2023-03-10 NOTE — Telephone Encounter (Signed)
Ok to place referral.  Katina Degree. Jimmey Ralph, MD 03/10/2023 12:23 PM

## 2023-03-13 NOTE — Telephone Encounter (Signed)
Spoke with patient, patient per insurance do not need referral at this time

## 2023-03-17 ENCOUNTER — Ambulatory Visit: Payer: Managed Care, Other (non HMO) | Admitting: Family Medicine

## 2023-09-11 ENCOUNTER — Encounter: Payer: Self-pay | Admitting: Family Medicine

## 2023-09-11 ENCOUNTER — Ambulatory Visit: Payer: Managed Care, Other (non HMO) | Admitting: Family Medicine

## 2023-09-11 VITALS — BP 110/72 | HR 65 | Temp 98.4°F | Ht 68.0 in | Wt 212.6 lb

## 2023-09-11 DIAGNOSIS — E785 Hyperlipidemia, unspecified: Secondary | ICD-10-CM

## 2023-09-11 DIAGNOSIS — G8929 Other chronic pain: Secondary | ICD-10-CM | POA: Insufficient documentation

## 2023-09-11 DIAGNOSIS — F321 Major depressive disorder, single episode, moderate: Secondary | ICD-10-CM | POA: Diagnosis not present

## 2023-09-11 DIAGNOSIS — M545 Low back pain, unspecified: Secondary | ICD-10-CM | POA: Diagnosis not present

## 2023-09-11 DIAGNOSIS — I10 Essential (primary) hypertension: Secondary | ICD-10-CM

## 2023-09-11 DIAGNOSIS — R1031 Right lower quadrant pain: Secondary | ICD-10-CM

## 2023-09-11 NOTE — Progress Notes (Signed)
   Eugene Mitchell is a 48 y.o. male who presents today for an office visit.  Assessment/Plan:  New/Acute Problems: RLQ Abdominal Pain Overall reassuring exam. No peritoneal signs on exam.  Symptoms are improving.  Could be related to excess gas production related to his increased protein intake.  His symptoms have improved since adjusting his diet.  He is status post cholecystectomy.  Retained stone is on the differential though not likely based on his exam and history.  Given his improvement of symptoms and overall reassuring exam today do not think that we need to take any sort of imaging at this point.  We discussed simethicone and probiotic supplements.  He will let us know if not improving or if symptoms worsen and we can check imaging or refer to gastroenterology at that time.  We discussed reasons to return to care.  Chronic Problems Addressed Today: Depression, major, single episode, moderate (HCC) He is now following with psychiatry.  His symptoms have improved significantly since our last visit.  He is on Wellbutrin 300 mg daily, gabapentin 100 mg nightly and trazodone 50 mg nightly.  Hyperlipidemia He will come back soon for CPE and we can check lipids at that time.  Chronic low back pain Follows with Ortho and chiropractor for this.  He did have a flareup a few weeks ago however this is improving.  Anticipate this will continue to improve the next few weeks however he will let us know if he does not have continue improvement or if he has any changes in symptoms..     Subjective:  HPI:  Patient here with right upper quadrant abdominal pain for the last week or two. Described as a stabbing pain. Comes and goes and go. Pain lasts for a few seconds and then goes away. No constipation or diarrhea. No fever. No nausea or vomiting. Symptoms stable the last week. No melena or hematochezia.  He has been eating a lot more protein than normal last few weeks.  He has noticed more gas  production.  He stopped eating as much protein a week ago or so and symptoms have began to improve.  He did have some back a few weeks ago as well and has been working with chiropractor for this.  Symptoms have improved significantly though still does have occasional pain in the area.       Objective:  Physical Exam: BP 110/72   Pulse 65   Temp 98.4 F (36.9 C) (Temporal)   Ht 5\' 8"  (1.727 m)   Wt 212 lb 9.6 oz (96.4 kg)   SpO2 95%   BMI 32.33 kg/m   Gen: No acute distress, resting comfortably CV: Regular rate and rhythm with no murmurs appreciated Pulm: Normal work of breathing, clear to auscultation bilaterally with no crackles, wheezes, or rhonchi GI: Bowel sounds present, soft, nontender, nondistended.  Murphy sign negative. Neuro: Grossly normal, moves all extremities Psych: Normal affect and thought content      Mishell Donalson M. Jimmey Ralph, MD 09/11/2023 2:04 PM

## 2023-09-11 NOTE — Assessment & Plan Note (Signed)
He will come back soon for CPE and we can check lipids at that time.

## 2023-09-11 NOTE — Assessment & Plan Note (Signed)
He is now following with psychiatry.  His symptoms have improved significantly since our last visit.  He is on Wellbutrin 300 mg daily, gabapentin 100 mg nightly and trazodone 50 mg nightly.

## 2023-09-11 NOTE — Assessment & Plan Note (Signed)
Follows with Ortho and chiropractor for this.  He did have a flareup a few weeks ago however this is improving.  Anticipate this will continue to improve the next few weeks however he will let us know if he does not have continue improvement or if he has any changes in symptoms.Marland Kitchen

## 2023-09-11 NOTE — Patient Instructions (Addendum)
It was very nice to see you today!  I think you are probably having gas pains.  Please try the simethicone.  Please also start a probiotic supplement such as Culturelle or Florastor.  Return if symptoms worsen or fail to improve.   Take care, Dr Jimmey Ralph  PLEASE NOTE:  If you had any lab tests, please let us know if you have not heard back within a few days. You may see your results on mychart before we have a chance to review them but we will give you a call once they are reviewed by [place].   If we ordered any referrals today, please let us know if you have not heard from their office within the next week.   If you had any urgent prescriptions sent in today, please check with the pharmacy within an hour of our visit to make sure the prescription was transmitted appropriately.   Please try these tips to maintain a healthy lifestyle:  Eat at least 3 REAL meals and 1-2 snacks per day.  Aim for no more than 5 hours between eating.  If you eat breakfast, please do so within one hour of getting up.   Each meal should contain half fruits/vegetables, one quarter protein, and one quarter carbs (no bigger than a computer mouse)  Cut down on sweet beverages. This includes juice, soda, and sweet tea.   Drink at least 1 glass of water with each meal and aim for at least 8 glasses per day  Exercise at least 150 minutes every week.

## 2023-10-23 ENCOUNTER — Ambulatory Visit: Admitting: Family Medicine

## 2023-10-23 ENCOUNTER — Encounter: Payer: Self-pay | Admitting: Family Medicine

## 2023-10-23 VITALS — BP 141/81 | HR 69 | Temp 98.4°F | Ht 68.0 in | Wt 210.4 lb

## 2023-10-23 DIAGNOSIS — E785 Hyperlipidemia, unspecified: Secondary | ICD-10-CM | POA: Diagnosis not present

## 2023-10-23 DIAGNOSIS — Z114 Encounter for screening for human immunodeficiency virus [HIV]: Secondary | ICD-10-CM | POA: Diagnosis not present

## 2023-10-23 DIAGNOSIS — R109 Unspecified abdominal pain: Secondary | ICD-10-CM

## 2023-10-23 DIAGNOSIS — Z1159 Encounter for screening for other viral diseases: Secondary | ICD-10-CM

## 2023-10-23 DIAGNOSIS — A159 Respiratory tuberculosis unspecified: Secondary | ICD-10-CM

## 2023-10-23 DIAGNOSIS — I10 Essential (primary) hypertension: Secondary | ICD-10-CM | POA: Diagnosis not present

## 2023-10-23 DIAGNOSIS — Z131 Encounter for screening for diabetes mellitus: Secondary | ICD-10-CM | POA: Diagnosis not present

## 2023-10-23 DIAGNOSIS — F321 Major depressive disorder, single episode, moderate: Secondary | ICD-10-CM

## 2023-10-23 LAB — COMPREHENSIVE METABOLIC PANEL WITH GFR
ALT: 18 U/L (ref 0–53)
AST: 13 U/L (ref 0–37)
Albumin: 4.8 g/dL (ref 3.5–5.2)
Alkaline Phosphatase: 64 U/L (ref 39–117)
BUN: 13 mg/dL (ref 6–23)
CO2: 28 meq/L (ref 19–32)
Calcium: 9.4 mg/dL (ref 8.4–10.5)
Chloride: 104 meq/L (ref 96–112)
Creatinine, Ser: 0.76 mg/dL (ref 0.40–1.50)
GFR: 105.77 mL/min (ref >60.00)
Glucose, Bld: 91 mg/dL (ref 70–99)
Potassium: 4.4 meq/L (ref 3.5–5.1)
Sodium: 140 meq/L (ref 135–145)
Total Bilirubin: 0.7 mg/dL (ref 0.2–1.2)
Total Protein: 7.2 g/dL (ref 6.0–8.3)

## 2023-10-23 LAB — TSH: TSH: 0.75 u[IU]/mL (ref 0.35–5.50)

## 2023-10-23 LAB — CBC
HCT: 45.6 % (ref 39.0–52.0)
Hemoglobin: 15.3 g/dL (ref 13.0–17.0)
MCHC: 33.5 g/dL (ref 30.0–36.0)
MCV: 87.6 fl (ref 78.0–100.0)
Platelets: 189 10*3/uL (ref 150.0–400.0)
RBC: 5.21 Mil/uL (ref 4.22–5.81)
RDW: 12.8 % (ref 11.5–15.5)
WBC: 5.1 10*3/uL (ref 4.0–10.5)

## 2023-10-23 LAB — LIPID PANEL
Cholesterol: 168 mg/dL (ref 0–200)
HDL: 49 mg/dL (ref >39.00)
LDL Cholesterol: 103 mg/dL — ABNORMAL HIGH (ref 0–99)
NonHDL: 118.87
Total CHOL/HDL Ratio: 3
Triglycerides: 80 mg/dL (ref 0.0–149.0)
VLDL: 16 mg/dL (ref 0.0–40.0)

## 2023-10-23 LAB — LIPASE: Lipase: 28 U/L (ref 11.0–59.0)

## 2023-10-23 LAB — HEMOGLOBIN A1C: Hgb A1c MFr Bld: 5.7 % (ref 4.6–6.5)

## 2023-10-23 MED ORDER — PANTOPRAZOLE SODIUM 40 MG PO TBEC: 40.0000 mg | DELAYED_RELEASE_TABLET | Freq: Every day | ORAL | 3 refills | Status: DC

## 2023-10-23 NOTE — Assessment & Plan Note (Signed)
 Mildly elevated today.  Typically well-controlled he will monitor at home and let us know if persistently elevated.

## 2023-10-23 NOTE — Assessment & Plan Note (Signed)
 Check lipids today with labs.

## 2023-10-23 NOTE — Patient Instructions (Signed)
 It was very nice to see you today!  We will check blood work today.  We will also check a special breath test to look for a bacteria that can cause ulcers.  We will start an acid blocker medication.  Let me know in a few weeks how you are doing.  Return if symptoms worsen or fail to improve.   Take care, Dr Jimmey Ralph  PLEASE NOTE:  If you had any lab tests, please let us know if you have not heard back within a few days. You may see your results on mychart before we have a chance to review them but we will give you a call once they are reviewed by [place].   If we ordered any referrals today, please let us know if you have not heard from their office within the next week.   If you had any urgent prescriptions sent in today, please check with the pharmacy within an hour of our visit to make sure the prescription was transmitted appropriately.   Please try these tips to maintain a healthy lifestyle:  Eat at least 3 REAL meals and 1-2 snacks per day.  Aim for no more than 5 hours between eating.  If you eat breakfast, please do so within one hour of getting up.   Each meal should contain half fruits/vegetables, one quarter protein, and one quarter carbs (no bigger than a computer mouse)  Cut down on sweet beverages. This includes juice, soda, and sweet tea.   Drink at least 1 glass of water with each meal and aim for at least 8 glasses per day  Exercise at least 150 minutes every week.

## 2023-10-23 NOTE — Progress Notes (Signed)
   Eugene Mitchell is a 48 y.o. male who presents today for an office visit.  Assessment/Plan:  New/Acute Problems: Abdominal Pain  Overall reassuring exam.  No peritoneal signs.  Differential includes constipation, PUD, retained stone, pancreatitis, etc.  We did discuss imaging however he would like to hold off on this for now.  We will check labs today including H. pylori breath test, c-Met, TSH, and CBC.  We will also start Protonix 40 mg daily for the next 1 to 2 weeks.  We also did discuss referral to GI however he would also like to hold off on this for now.  He will let us know how he is doing in the next few weeks.  If he does have significant improvement with Protonix would consider extending course to 4 to 6 weeks to treat presumed PUD.  If symptoms do not improve and above workup is negative, he will need imaging and/or referral at that time.  We discussed reasons to return to care.  Chronic Problems Addressed Today: Hyperlipidemia Check lipids today with labs.   Hypertension Mildly elevated today.  Typically well-controlled he will monitor at home and let us know if persistently elevated.  Depression, major, single episode, moderate (HCC) Stable on Wellbutrin 300 mg daily.  Doing well.  No significant side effects.  Preventative health care Check labs.  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  Will order quadrangle today.    Subjective:  HPI:  See A/P for status of chronic conditions.  Patient is here today for follow-up.  I saw him 6 weeks ago for abdominal pain.  Since her last visit pain has persisted.  No significant change.  He did have a severe flare up of pain yesterday when cycling. Still getting sharp pain that is intermittent. He is getting some mild nausea. No vomiting. He has had some mild constipation. No diarrhea. No heartburn or reflux. He has not noticed any food that triggers his symptoms. Yesterday he was having some epigastric pain as  well. Appetite has been normal.        Objective:  Physical Exam: BP (!) 141/81   Pulse 69   Temp 98.4 F (36.9 C) (Temporal)   Ht 5\' 8"  (1.727 m)   Wt 210 lb 6.4 oz (95.4 kg)   SpO2 98%   BMI 31.99 kg/m   Gen: No acute distress, resting comfortably CV: Regular rate and rhythm with no murmurs appreciated Pulm: Normal work of breathing, clear to auscultation bilaterally with no crackles, wheezes, or rhonchi Abdomen: No deformities.  Bowel sounds present.  Soft.  Mild tenderness palpation in epigastric and right upper quadrant area.  No rebound or guarding.  Nondistended. Neuro: Grossly normal, moves all extremities Psych: Normal affect and thought content      Eugene Mitchell M. Jimmey Ralph, MD 10/23/2023 9:17 AM

## 2023-10-23 NOTE — Assessment & Plan Note (Signed)
 Stable on Wellbutrin 300 mg daily.  Doing well.  No significant side effects.

## 2023-10-25 LAB — H. PYLORI BREATH TEST: H. pylori Breath Test: NOT DETECTED

## 2023-10-26 ENCOUNTER — Encounter: Payer: Managed Care, Other (non HMO) | Admitting: Family Medicine

## 2023-10-27 ENCOUNTER — Encounter: Payer: Self-pay | Admitting: Family Medicine

## 2023-10-27 LAB — QUANTIFERON-TB GOLD PLUS
Mitogen-NIL: 8.81 [IU]/mL
NIL: 0.03 [IU]/mL
QuantiFERON-TB Gold Plus: NEGATIVE
TB1-NIL: 0.01 [IU]/mL
TB2-NIL: 0.01 [IU]/mL

## 2023-10-27 LAB — HEPATITIS C ANTIBODY: Hepatitis C Ab: NONREACTIVE

## 2023-10-27 LAB — HIV ANTIBODY (ROUTINE TESTING W REFLEX): HIV 1&2 Ab, 4th Generation: NONREACTIVE

## 2023-10-27 NOTE — Progress Notes (Signed)
 Cholesterol is mildly elevated but the rest of his labs are all normal.  His TB test is negative.  No obvious explanation for his symptoms based on the labs.  He should let us know if his symptoms are not improving with acid blocker we sent in.

## 2023-11-02 ENCOUNTER — Ambulatory Visit (INDEPENDENT_AMBULATORY_CARE_PROVIDER_SITE_OTHER): Admitting: Family Medicine

## 2023-11-02 VITALS — BP 129/82 | HR 59 | Temp 98.6°F | Ht 68.0 in | Wt 213.6 lb

## 2023-11-02 DIAGNOSIS — M545 Low back pain, unspecified: Secondary | ICD-10-CM

## 2023-11-02 DIAGNOSIS — F321 Major depressive disorder, single episode, moderate: Secondary | ICD-10-CM | POA: Diagnosis not present

## 2023-11-02 DIAGNOSIS — E785 Hyperlipidemia, unspecified: Secondary | ICD-10-CM | POA: Diagnosis not present

## 2023-11-02 DIAGNOSIS — I10 Essential (primary) hypertension: Secondary | ICD-10-CM

## 2023-11-02 DIAGNOSIS — G8929 Other chronic pain: Secondary | ICD-10-CM

## 2023-11-02 DIAGNOSIS — Z0000 Encounter for general adult medical examination without abnormal findings: Secondary | ICD-10-CM

## 2023-11-02 NOTE — Patient Instructions (Signed)
 It was very nice to see you today!  Please keep up the great work with your diet and exercise.  Please try daily MiraLAX to see if this helps with your symptoms.  Please continue the Protonix.  Let me know in a few weeks how you are doing.  Will see back in year for your next physical.  Come back to see [place] sooner if needed.  Return in about 1 year (around 11/01/2024) for Annual Physical.   Take care, Dr Jimmey Ralph  PLEASE NOTE:  If you had any lab tests, please let us know if you have not heard back within a few days. You may see your results on mychart before we have a chance to review them but we will give you a call once they are reviewed by [place].   If we ordered any referrals today, please let us know if you have not heard from their office within the next week.   If you had any urgent prescriptions sent in today, please check with the pharmacy within an hour of our visit to make sure the prescription was transmitted appropriately.   Please try these tips to maintain a healthy lifestyle:  Eat at least 3 REAL meals and 1-2 snacks per day.  Aim for no more than 5 hours between eating.  If you eat breakfast, please do so within one hour of getting up.   Each meal should contain half fruits/vegetables, one quarter protein, and one quarter carbs (no bigger than a computer mouse)  Cut down on sweet beverages. This includes juice, soda, and sweet tea.   Drink at least 1 glass of water with each meal and aim for at least 8 glasses per day  Exercise at least 150 minutes every week.    Preventive Care 13-63 Years Old, Male Preventive care refers to lifestyle choices and visits with your health care provider that can promote health and wellness. Preventive care visits are also called wellness exams. What can I expect for my preventive care visit? Counseling During your preventive care visit, your health care provider may ask about your: Medical history, including: Past medical  problems. Family medical history. Current health, including: Emotional well-being. Home life and relationship well-being. Sexual activity. Lifestyle, including: Alcohol, nicotine or tobacco, and drug use. Access to firearms. Diet, exercise, and sleep habits. Safety issues such as seatbelt and bike helmet use. Sunscreen use. Work and work Astronomer. Physical exam Your health care provider will check your: Height and weight. These may be used to calculate your BMI (body mass index). BMI is a measurement that tells if you are at a healthy weight. Waist circumference. This measures the distance around your waistline. This measurement also tells if you are at a healthy weight and may help predict your risk of certain diseases, such as type 2 diabetes and high blood pressure. Heart rate and blood pressure. Body temperature. Skin for abnormal spots. What immunizations do I need?  Vaccines are usually given at various ages, according to a schedule. Your health care provider will recommend vaccines for you based on your age, medical history, and lifestyle or other factors, such as travel or where you work. What tests do I need? Screening Your health care provider may recommend screening tests for certain conditions. This may include: Lipid and cholesterol levels. Diabetes screening. This is done by checking your blood sugar (glucose) after you have not eaten for a while (fasting). Hepatitis B test. Hepatitis C test. HIV (human immunodeficiency virus) test. STI (  sexually transmitted infection) testing, if you are at risk. Lung cancer screening. Prostate cancer screening. Colorectal cancer screening. Talk with your health care provider about your test results, treatment options, and if necessary, the need for more tests. Follow these instructions at home: Eating and drinking  Eat a diet that includes fresh fruits and vegetables, whole grains, lean protein, and low-fat dairy  products. Take vitamin and mineral supplements as recommended by your health care provider. Do not drink alcohol if your health care provider tells you not to drink. If you drink alcohol: Limit how much you have to 0-2 drinks a day. Know how much alcohol is in your drink. In the [place] one drink equals one 12 oz bottle of beer (355 mL), one 5 oz glass of wine (148 mL), or one 1 oz glass of hard liquor (44 mL). Lifestyle Brush your teeth every morning and night with fluoride toothpaste. Floss one time each day. Exercise for at least 30 minutes 5 or more days each week. Do not use any products that contain nicotine or tobacco. These products include cigarettes, chewing tobacco, and vaping devices, such as e-cigarettes. If you need help quitting, ask your health care provider. Do not use drugs. If you are sexually active, practice safe sex. Use a condom or other form of protection to prevent STIs. Take aspirin only as told by your health care provider. Make sure that you understand how much to take and what form to take. Work with your health care provider to find out whether it is safe and beneficial for you to take aspirin daily. Find healthy ways to manage stress, such as: Meditation, yoga, or listening to music. Journaling. Talking to a trusted person. Spending time with friends and family. Minimize exposure to UV radiation to reduce your risk of skin cancer. Safety Always wear your seat belt while driving or riding in a vehicle. Do not drive: If you have been drinking alcohol. Do not ride with someone who has been drinking. When you are tired or distracted. While texting. If you have been using any mind-altering substances or drugs. Wear a helmet and other protective equipment during sports activities. If you have firearms in your house, make sure you follow all gun safety procedures. What's next? Go to your health care provider once a year for an annual wellness visit. Ask your  health care provider how often you should have your eyes and teeth checked. Stay up to date on all vaccines. This information is not intended to replace advice given to you by your health care provider. Make sure you discuss any questions you have with your health care provider. Document Revised: 01/09/2021 Document Reviewed: 01/09/2021 Elsevier Patient Education  2024 [employer].

## 2023-11-02 NOTE — Assessment & Plan Note (Signed)
 At goal today

## 2023-11-02 NOTE — Assessment & Plan Note (Signed)
 Follows with chiropractor.  Overall symptoms are manageable currently.

## 2023-11-02 NOTE — Assessment & Plan Note (Signed)
 Doing very well on Wellbutrin 300 mg daily.  Also transitioning jobs which has significantly helped with his mental health.

## 2023-11-02 NOTE — Assessment & Plan Note (Signed)
 LDL mildly elevated however has great ratio.  He is doing a great job with diet and exercise.  We can recheck again in a year.

## 2023-11-02 NOTE — Progress Notes (Signed)
 Chief Complaint:  Eugene Mitchell is a 48 y.o. male who presents today for his annual comprehensive physical exam.    Assessment/Plan:  New/Acute Problems: Abdominal Pain  Improving.  May have some constipation which is contributing as well.  Recommended daily MiraLAX as needed to have 1-2 soft Abalos daily.  Will continue Protonix as he has had some benefit with this over the last week or so as well.  Overall reassuring exam today.  He will follow-up with [place] in a few weeks.  If not improving would consider CT scan versus referral to GI.  Chronic Problems Addressed Today: Hyperlipidemia LDL mildly elevated however has great ratio.  He is doing a great job with diet and exercise.  We can recheck again in a year.  Hypertension At goal today.  Depression, major, single episode, moderate (HCC) Doing very well on Wellbutrin 300 mg daily.  Also transitioning jobs which has significantly helped with his mental health.  Chronic low back pain Follows with chiropractor.  Overall symptoms are manageable currently.  Preventative Healthcare: Up-to-date on vaccines and screenings.  Recently had labs.  We can check PSA next year.  Patient Counseling(The following topics were reviewed and/or handout was given):  -Nutrition: Stressed importance of moderation in sodium/caffeine intake, saturated fat and cholesterol, caloric balance, sufficient intake of fresh fruits, vegetables, and fiber.  -Stressed the importance of regular exercise.   -Substance Abuse: Discussed cessation/primary prevention of tobacco, alcohol, or other drug use; driving or other dangerous activities under the influence; availability of treatment for abuse.   -Injury prevention: Discussed safety belts, safety helmets, smoke detector, smoking near bedding or upholstery.   -Sexuality: Discussed sexually transmitted diseases, partner selection, use of condoms, avoidance of unintended pregnancy and contraceptive alternatives.    -Dental health: Discussed importance of regular tooth brushing, flossing, and dental visits.  -Health maintenance and immunizations reviewed. Please refer to Health maintenance section.  Return to care in 1 year for next preventative visit.     Subjective:  HPI:  He has no acute complaints today. See Assessment / plan for status of chronic conditions. I did see him a week and a half ago for abdominal pain. We checked labs at that visit as well.    We started him on protonix at his last visit. This did help for a week or so but had a recurrence a few days ago. He does not that he has had some intermittent issues with constipation for awhile as well Sometimes goes 2-3 days inbetween bowel movements.  Pain is currently manageable today.  Comes and goes.  Located on the right side as before.  No other obvious aggravating or alleviating factors.  Lifestyle Diet: Balanced. Cutting down on red meat.  Exercise: Cycling 2-3 times per week.      11/02/2023    7:44 AM  Depression screen PHQ 2/9  Decreased Interest 0  Down, Depressed, Hopeless 0  PHQ - 2 Score 0  Altered sleeping 0  Tired, decreased energy 0  Change in appetite 0  Feeling bad or failure about yourself  0  Trouble concentrating 0  Moving slowly or fidgety/restless 0  Suicidal thoughts 0  PHQ-9 Score 0  Difficult doing work/chores Not difficult at all    There are no preventive care reminders to display for this patient.   ROS: Per HPI, otherwise a complete review of systems was negative.   PMH:  The following were reviewed and entered/updated in epic: Past Medical History:  Diagnosis  Date   Depression    Hypercholesteremia    Hypertension    TIA (transient ischemic attack) 2016   Patient Active Problem List   Diagnosis Date Noted   Chronic low back pain 09/11/2023   Headache 02/09/2023   Obesity 11/30/2020   Depression, major, single episode, moderate (HCC) 08/07/2020   Rash 02/04/2019   Skin cancer  07/07/2018   Hyperlipidemia 10/18/2014   Hypertension 12/30/2012   Past Surgical History:  Procedure Laterality Date   CHOLECYSTECTOMY     ELBOW SURGERY Left     Family History  Problem Relation Age of Onset   Thyroid disease Mother    Cancer Mother        Lung   Stroke Father    Coronary artery disease Father    Cancer Father        Lung   Colon cancer Neg Hx    Esophageal cancer Neg Hx    Rectal cancer Neg Hx    Stomach cancer Neg Hx     Medications- reviewed and updated Current Outpatient Medications  Medication Sig Dispense Refill   buPROPion (WELLBUTRIN XL) 150 MG 24 hr tablet Take 2 tablets (300 mg total) by mouth daily. 60 tablet 5   econazole nitrate 1 % cream Apply topically 2 (two) times daily. 15 g 1   fluorouracil (EFUDEX) 5 % cream Apply topically 2 (two) times daily.     gabapentin (NEURONTIN) 100 MG capsule Take by mouth.     hydrocortisone 2.5 % cream Apply 1 Application topically 2 (two) times daily.     pantoprazole (PROTONIX) 40 MG tablet Take 1 tablet (40 mg total) by mouth daily. 30 tablet 3   traZODone (DESYREL) 50 MG tablet Take 50 mg by mouth at bedtime.     Triamcinolone Acetonide (TRIAMCINOLONE 0.1 % CREAM : EUCERIN) CREA Apply 1 application topically 2 (two) times daily. 1 each 3   No current facility-administered medications for this visit.    Allergies-reviewed and updated Allergies  Allergen Reactions   Esomeprazole Swelling    Social History   Socioeconomic History   Marital status: Married    Spouse name: Not on file   Number of children: 2   Years of education: 12   Highest education level: Not on file  Occupational History   Occupation: Designer, industrial/product  Tobacco Use   Smoking status: Former    Current packs/day: 0.00    Types: Cigarettes    Quit date: 10/12/2011    Years since quitting: 12.0   Smokeless tobacco: Never  Vaping Use   Vaping status: Never Used  Substance and Sexual Activity   Alcohol use: Yes     Comment: Socially   Drug use: No   Sexual activity: Yes    Partners: Male  Other Topics Concern   Not on file  Social History Narrative   Lives at home with his parents.   Left-handed.   3-6 cups caffeine daily.   Social Drivers of Corporate investment banker Strain: Not on file  Food Insecurity: Not on file  Transportation Needs: Not on file  Physical Activity: Not on file  Stress: Not on file  Social Connections: Not on file        Objective:  Physical Exam: BP 129/82   Pulse (!) 59   Temp 98.6 F (37 C) (Temporal)   Ht 5\' 8"  (1.727 m)   Wt 213 lb 9.6 oz (96.9 kg)   SpO2 97%   BMI 32.48  kg/m   Body mass index is 32.48 kg/m. Wt Readings from Last 3 Encounters:  11/02/23 213 lb 9.6 oz (96.9 kg)  10/23/23 210 lb 6.4 oz (95.4 kg)  09/11/23 212 lb 9.6 oz (96.4 kg)   Gen: NAD, resting comfortably HEENT: TMs normal bilaterally. OP clear. No thyromegaly noted.  CV: RRR with no murmurs appreciated Pulm: NWOB, CTAB with no crackles, wheezes, or rhonchi GI: Normal bowel sounds present. Soft, Nontender, Nondistended. MSK: no edema, cyanosis, or clubbing noted Skin: warm, dry Neuro: CN2-12 grossly intact. Strength 5/5 in upper and lower extremities. Reflexes symmetric and intact bilaterally.  Psych: Normal affect and thought content     Eloina Ergle M. Jimmey Ralph, MD 11/02/2023 8:23 AM

## 2023-11-08 ENCOUNTER — Encounter: Payer: Self-pay | Admitting: Family Medicine

## 2023-11-09 ENCOUNTER — Other Ambulatory Visit: Payer: Self-pay | Admitting: *Deleted

## 2023-11-09 DIAGNOSIS — R109 Unspecified abdominal pain: Secondary | ICD-10-CM

## 2023-11-09 NOTE — Telephone Encounter (Signed)
CT order

## 2023-11-09 NOTE — Telephone Encounter (Signed)
**Note De-identified  [place] Obfuscation** Please advise 

## 2023-11-25 ENCOUNTER — Ambulatory Visit
Admission: RE | Admit: 2023-11-25 | Discharge: 2023-11-25 | Disposition: A | Discharge status: Home / Self Care | Source: Ambulatory Visit | Attending: Family Medicine | Admitting: Family Medicine

## 2023-11-25 DIAGNOSIS — R109 Unspecified abdominal pain: Secondary | ICD-10-CM

## 2023-11-25 MED ORDER — IOPAMIDOL (ISOVUE-370) INJECTION 76%
80.0000 mL | Freq: Once | INTRAVENOUS | Status: AC | PRN
  Administered 2023-11-25: 80 mL via INTRAVENOUS

## 2023-11-27 ENCOUNTER — Encounter: Payer: Self-pay | Admitting: Family Medicine

## 2023-11-27 NOTE — Progress Notes (Signed)
 Good news!  His CT scan did not show any abnormalities.  He should let us  know if his pain is not improving and we can refer to GI.

## 2024-01-19 ENCOUNTER — Encounter: Admitting: Family Medicine

## 2024-07-04 ENCOUNTER — Ambulatory Visit: Admitting: Family Medicine

## 2024-07-04 VITALS — BP 122/88 | HR 57 | Temp 98.1°F | Ht 68.0 in | Wt 216.4 lb

## 2024-07-04 DIAGNOSIS — F321 Major depressive disorder, single episode, moderate: Secondary | ICD-10-CM

## 2024-07-04 DIAGNOSIS — M79622 Pain in left upper arm: Secondary | ICD-10-CM | POA: Diagnosis not present

## 2024-07-04 DIAGNOSIS — I10 Essential (primary) hypertension: Secondary | ICD-10-CM | POA: Diagnosis not present

## 2024-07-04 DIAGNOSIS — M6208 Separation of muscle (nontraumatic), other site: Secondary | ICD-10-CM | POA: Diagnosis not present

## 2024-07-04 NOTE — Assessment & Plan Note (Signed)
 Symptoms are very well-controlled today.  No longer on the Wellbutrin .  He has recently changed jobs and has had significant improvement in symptoms.

## 2024-07-04 NOTE — Assessment & Plan Note (Signed)
 Blood pressure at goal today without meds.

## 2024-07-04 NOTE — Patient Instructions (Signed)
 It was very nice to see you today!  VISIT SUMMARY: You visited us  today with concerns about a gastric hernia and persistent chest tenderness. We discussed your symptoms, reviewed your history, and provided guidance on managing your conditions.  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 It is not a hernia and does not require surgery. -Maintain general health through weight loss, core strengthening, and regular exercise.  LEFT PECTORALIS MUSCLE STRAIN: You have intermittent tenderness likely due to a muscle strain in your chest. -An ultrasound of the right pectoral area was ordered to further investigate the tenderness.  ESSENTIAL HYPERTENSION: Your blood pressure is well-controlled without current medications. -Continue monitoring your blood pressure regularly.  Return if symptoms worsen or fail to improve.   Take care, Dr Kennyth  PLEASE NOTE:  If you had any lab tests, please let us  know if you have not heard back within a few days. You may see your results on mychart before we have a chance to review them but we will give you a call once they are reviewed by us .   If we ordered any referrals today, please let us  know if you have not heard from their office within the next week.   If you had any urgent prescriptions sent in today, please check with the pharmacy within an hour of our visit to make sure the prescription was transmitted appropriately.   Please try these tips to maintain a healthy lifestyle:  Eat at least 3 REAL meals and 1-2 snacks per day.  Aim for no more than 5 hours between eating.  If you eat breakfast, please do so within one hour of getting up.   Each meal should contain half fruits/vegetables, one quarter protein, and one quarter carbs (no bigger than a computer mouse)  Cut down on sweet beverages. This includes juice, soda, and sweet tea.   Drink at least 1 glass of water with each  meal and aim for at least 8 glasses per day  Exercise at least 150 minutes every week.

## 2024-07-04 NOTE — Progress Notes (Signed)
   Eugene Mitchell is a 48 y.o. male who presents today for an office visit.  Assessment/Plan:  New/Acute Problems: Rectus diastases No red flags.  Reassured patient.  Discussed weight loss and core strengthening exercises.  Left axilla pain Likely muscular strain from cycling.  He had something similar a year ago that resolved with anti-inflammatories.  We did discuss another trial of anti-inflammatories however given recurrent nature would be reasonable for us  to pursue imaging at this point.  Will place order for ultrasound.  Chronic Problems Addressed Today: Hypertension Blood pressure at goal today without meds.  Depression, major, single episode, moderate (HCC) Symptoms are very well-controlled today.  No longer on the Wellbutrin .  He has recently changed jobs and has had significant improvement in symptoms.     Subjective:  HPI:  See assessment / plan for status of chronic conditions.   Discussed the use of AI scribe software for clinical note transcription with the patient, who gave verbal consent to proceed.  History of Present Illness Eugene Mitchell is a 48 year old male who presents with concerns about a gastric hernia and persistent chest tenderness.  He is concerned about a protrusion in his abdomen that was noticed during an agility test for his new job with [employer]. During the test, he was required to lift his legs while lying on his back, which caused a protrusion in his abdomen. It causes occasional pain but does not affect his job performance. He is worried about the possibility of needing surgery and would prefer to avoid it due to job requirements.  He reports persistent tenderness on the side of his breast, described as 'real tender' and 'sore with the touch.' This tenderness has been present intermittently for over a year, with periods of improvement and recurrence. He recalls taking diclofenac  about a year ago, which provided some relief. The  pain sometimes occurs without any touch and is located on the side of his breast, near the armpit.  He had a CT scan in May for sharp abdominal pain, which he associates with excessive coffee consumption. Reducing coffee intake alleviated the pain. He is currently off all medications. He continues to cycle frequently and wonders if this activity might be related to his symptoms.         Objective:  Physical Exam: BP 122/88   Pulse (!) 57   Temp 98.1 F (36.7 C) (Temporal)   Ht 5' 8 (1.727 m)   Wt 216 lb 6.4 oz (98.2 kg)   SpO2 96%   BMI 32.90 kg/m   Gen: No acute distress, resting comfortably CV: Regular rate and rhythm with no murmurs appreciated Pulm: Normal work of breathing, clear to auscultation bilaterally with no crackles, wheezes, or rhonchi Abdomen: Rectus diastases noted.  No hernia appreciated MUSCULOSKELETAL: - Left Axilla: No deformities.  Tender to palpation along lateral pectoralis.  No lymphadenopathy noted.  No masses Neuro: Grossly normal, moves all extremities Psych: Normal affect and thought content      Eugene Verge M. Kennyth, MD 07/04/2024 11:51 AM

## 2024-11-03 ENCOUNTER — Encounter: Admitting: Family Medicine

## 2024-11-04 ENCOUNTER — Encounter: Admitting: Family Medicine
